# Patient Record
Sex: Male | Born: 1980 | Race: White | Hispanic: No | Marital: Married | Smoking: Never smoker
Health system: Southern US, Community
[De-identification: ages and names within clinical notes are randomized; demographics above are authoritative.]

## PROBLEM LIST (undated history)

## (undated) DIAGNOSIS — F319 Bipolar disorder, unspecified: Secondary | ICD-10-CM

## (undated) DIAGNOSIS — F952 Tourette's disorder: Secondary | ICD-10-CM

## (undated) DIAGNOSIS — E7800 Pure hypercholesterolemia, unspecified: Secondary | ICD-10-CM

## (undated) DIAGNOSIS — E119 Type 2 diabetes mellitus without complications: Secondary | ICD-10-CM

---

## 2020-02-03 ENCOUNTER — Ambulatory Visit: Payer: Medicaid Other | Attending: Internal Medicine

## 2020-02-03 DIAGNOSIS — Z23 Encounter for immunization: Secondary | ICD-10-CM

## 2020-02-03 NOTE — Progress Notes (Signed)
   Covid-19 Vaccination Clinic  Name:  Andrew Lindsey    MRN: [id number] DOB: 08-21-1981  02/03/2020  Mr. Duchemin was observed post Covid-19 immunization for 15 minutes without incident. He was provided with Vaccine Information Sheet and instruction to access the V-Safe system.   Mr. Gersten was instructed to call 911 with any severe reactions post vaccine: Marland Kitchen Difficulty breathing  . Swelling of face and throat  . A fast heartbeat  . A bad rash all over body  . Dizziness and weakness   Immunizations Administered    Name Date Dose VIS Date Route   Pfizer COVID-19 Vaccine 02/03/2020 11:38 AM 0.3 mL 10/29/2019 Intramuscular   Manufacturer: [employer], [employer]   Lot: [id number]   NDC: [address]-8

## 2020-02-28 ENCOUNTER — Ambulatory Visit: Payer: Medicaid Other | Attending: Internal Medicine

## 2020-02-28 DIAGNOSIS — Z23 Encounter for immunization: Secondary | ICD-10-CM

## 2020-02-28 NOTE — Progress Notes (Signed)
   Covid-19 Vaccination Clinic  Name:  Aws Shere    MRN: [id number] DOB: December 29, 1980  02/28/2020  Mr. Imran was observed post Covid-19 immunization for 15 minutes without incident. He was provided with Vaccine Information Sheet and instruction to access the V-Safe system.   Mr. Taglieri was instructed to call 911 with any severe reactions post vaccine: Marland Kitchen Difficulty breathing  . Swelling of face and throat  . A fast heartbeat  . A bad rash all over body  . Dizziness and weakness   Immunizations Administered    Name Date Dose VIS Date Route   Pfizer COVID-19 Vaccine 02/28/2020  8:33 AM 0.3 mL 10/29/2019 Intramuscular   Manufacturer: [employer], [employer]   Lot: [id number]   NDC: [address]-8

## 2021-01-08 ENCOUNTER — Other Ambulatory Visit: Payer: Self-pay

## 2021-01-08 ENCOUNTER — Emergency Department (HOSPITAL_BASED_OUTPATIENT_CLINIC_OR_DEPARTMENT_OTHER)
Admission: EM | Admit: 2021-01-08 | Discharge: 2021-01-08 | Disposition: A | Discharge status: Home / Self Care | Payer: Medicaid Other | Attending: Emergency Medicine | Admitting: Emergency Medicine

## 2021-01-08 ENCOUNTER — Emergency Department (HOSPITAL_BASED_OUTPATIENT_CLINIC_OR_DEPARTMENT_OTHER): Payer: Medicaid Other

## 2021-01-08 ENCOUNTER — Encounter (HOSPITAL_BASED_OUTPATIENT_CLINIC_OR_DEPARTMENT_OTHER): Payer: Self-pay | Admitting: Emergency Medicine

## 2021-01-08 DIAGNOSIS — E119 Type 2 diabetes mellitus without complications: Secondary | ICD-10-CM | POA: Diagnosis not present

## 2021-01-08 DIAGNOSIS — R42 Dizziness and giddiness: Secondary | ICD-10-CM | POA: Insufficient documentation

## 2021-01-08 HISTORY — DX: Tourette's disorder: F95.2

## 2021-01-08 HISTORY — DX: Pure hypercholesterolemia, unspecified: E78.00

## 2021-01-08 HISTORY — DX: Bipolar disorder, unspecified: F31.9

## 2021-01-08 HISTORY — DX: Type 2 diabetes mellitus without complications: E11.9

## 2021-01-08 LAB — CBC WITH DIFFERENTIAL/PLATELET
Abs Immature Granulocytes: 0.05 10*3/uL (ref 0.00–0.07)
Basophils Absolute: 0 10*3/uL (ref 0.0–0.1)
Basophils Relative: 1 %
Eosinophils Absolute: 0.2 10*3/uL (ref 0.0–0.5)
Eosinophils Relative: 3 %
HCT: 42.7 % (ref 39.0–52.0)
Hemoglobin: 15 g/dL (ref 13.0–17.0)
Immature Granulocytes: 1 %
Lymphocytes Relative: 26 %
Lymphs Abs: 1.7 10*3/uL (ref 0.7–4.0)
MCH: 30.2 pg (ref 26.0–34.0)
MCHC: 35.1 g/dL (ref 30.0–36.0)
MCV: 85.9 fL (ref 80.0–100.0)
Monocytes Absolute: 0.3 10*3/uL (ref 0.1–1.0)
Monocytes Relative: 5 %
Neutro Abs: 4.1 10*3/uL (ref 1.7–7.7)
Neutrophils Relative %: 64 %
Platelets: 246 10*3/uL (ref 150–400)
RBC: 4.97 MIL/uL (ref 4.22–5.81)
RDW: 12.9 % (ref 11.5–15.5)
WBC: 6.4 10*3/uL (ref 4.0–10.5)
nRBC: 0 % (ref 0.0–0.2)

## 2021-01-08 LAB — BASIC METABOLIC PANEL
Anion gap: 9 (ref 5–15)
BUN: 20 mg/dL (ref 6–20)
CO2: 26 mmol/L (ref 22–32)
Calcium: 9.2 mg/dL (ref 8.9–10.3)
Chloride: 101 mmol/L (ref 98–111)
Creatinine, Ser: 1.01 mg/dL (ref 0.61–1.24)
GFR, Estimated: >60 mL/min (ref >60)
Glucose, Bld: 120 mg/dL — ABNORMAL HIGH (ref 70–99)
Potassium: 4.1 mmol/L (ref 3.5–5.1)
Sodium: 136 mmol/L (ref 135–145)

## 2021-01-08 LAB — CBG MONITORING, ED
Glucose-Capillary: 107 mg/dL — ABNORMAL HIGH (ref 70–99)
Glucose-Capillary: 110 mg/dL — ABNORMAL HIGH (ref 70–99)

## 2021-01-08 NOTE — ED Provider Notes (Signed)
[facility] EMERGENCY DEPARTMENT Provider Note   CSN: [id number] Arrival date & time: 01/08/21  4098     History Chief Complaint  Patient presents with  . Dizziness    Andrew Lindsey is a 40 y.o. male.  The history is provided by the patient.  Dizziness Quality:  Imbalance Severity:  Mild Onset quality:  Gradual Duration:  4 months Timing:  Intermittent Progression:  Waxing and waning Chronicity:  Recurrent Context comment:  Patient states some intermittnet dizziness, sometimes speech issues for the last few months, thought maybe ambilify side effect. Has stopped that medications for a few weeks now, now on amitrypitline. No current symptoms now. Relieved by:  Nothing Worsened by:  Nothing Associated symptoms: no chest pain, no headaches, no palpitations, no shortness of breath, no vomiting and no weakness   Risk factors: new medications        Past Medical History:  Diagnosis Date  . Bipolar 1 disorder (HCC)   . Diabetes mellitus without complication (HCC)   . Hypercholesteremia   . Tourette's     There are no problems to display for this patient.   PMH: Tourettes, HLD, DM     No family history on file.  Social History   Tobacco Use  . Smoking status: Never Smoker  . Smokeless tobacco: Never Used  Substance Use Topics  . Alcohol use: Not Currently  . Drug use: Never    Home Medications Prior to Admission medications   Medication Sig Start Date End Date Taking? Authorizing Provider  amitriptyline (ELAVIL) 150 MG tablet Take 150 mg by mouth at bedtime. 11/23/20   [provider]  benztropine (COGENTIN) 0.5 MG tablet benztropine 0.5 mg tablet  TK 1 T PO BID PRN    [provider]  glimepiride (AMARYL) 1 MG tablet glimepiride 1 mg tablet  TAKE 1 TO 2 TABLETS BY MOUTH EVERY DAY    [provider]    Allergies    Pravastatin  Review of Systems   Review of Systems  Constitutional: Negative for chills and fever.   HENT: Negative for ear pain and sore throat.   Eyes: Negative for pain and visual disturbance.  Respiratory: Negative for cough and shortness of breath.   Cardiovascular: Negative for chest pain and palpitations.  Gastrointestinal: Negative for abdominal pain and vomiting.  Genitourinary: Negative for dysuria and hematuria.  Musculoskeletal: Negative for arthralgias and back pain.  Skin: Negative for color change and rash.  Neurological: Positive for dizziness and speech difficulty. Negative for tremors, seizures, syncope, facial asymmetry, weakness, light-headedness, numbness and headaches.  All other systems reviewed and are negative.   Physical Exam Updated Vital Signs BP 121/87 (BP Location: Left Arm)   Pulse 88   Temp 97.6 F (36.4 C) (Oral)   Resp 19   Ht 5\' 6"  (1.676 m)   Wt 90.7 kg   SpO2 100%   BMI 32.28 kg/m   Physical Exam Vitals and nursing note reviewed.  Constitutional:      General: He is not in acute distress.    Appearance: He is well-developed and well-nourished. He is not ill-appearing.  HENT:     Head: Normocephalic and atraumatic.     Nose: Nose normal.     Mouth/Throat:     Mouth: Mucous membranes are moist.  Eyes:     Extraocular Movements: Extraocular movements intact.     Conjunctiva/sclera: Conjunctivae normal.     Pupils: Pupils are equal, round, and reactive to light.  Cardiovascular:     Rate and Rhythm: Normal rate and regular rhythm.     Pulses: Normal pulses.     Heart sounds: Normal heart sounds. No murmur heard.   Pulmonary:     Effort: Pulmonary effort is normal. No respiratory distress.     Breath sounds: Normal breath sounds.  Abdominal:     Palpations: Abdomen is soft.     Tenderness: There is no abdominal tenderness.  Musculoskeletal:        General: No edema.     Cervical back: Normal range of motion and neck supple.  Skin:    General: Skin is warm and dry.     Capillary Refill: Capillary refill takes less than 2  seconds.  Neurological:     General: No focal deficit present.     Mental Status: He is alert and oriented to person, place, and time.     Cranial Nerves: No cranial nerve deficit.     Sensory: No sensory deficit.     Motor: No weakness.     Coordination: Coordination normal.     Gait: Gait normal.     Comments: 5+ out of 5 strength all, normal sensation, normal gait, normal finger-nose-finger, no visual field deficit, normal speech, normal finger-to-nose finger  Psychiatric:        Mood and Affect: Mood and affect normal.     ED Results / Procedures / Treatments   Labs (all labs ordered are listed, but only abnormal results are displayed) Labs Reviewed  BASIC METABOLIC PANEL - Abnormal; Notable for the following components:      Result Value   Glucose, Bld 120 (*)    All other components within normal limits  CBG MONITORING, ED - Abnormal; Notable for the following components:   Glucose-Capillary 107 (*)    All other components within normal limits  CBG MONITORING, ED - Abnormal; Notable for the following components:   Glucose-Capillary 110 (*)    All other components within normal limits  CBC WITH DIFFERENTIAL/PLATELET    EKG EKG Interpretation  Date/Time:  Monday January 08 2021 08:24:14 EST Ventricular Rate:  89 PR Interval:    QRS Duration: 97 QT Interval:  347 QTC Calculation: 423 R Axis:   74 Text Interpretation: Sinus rhythm Confirmed by Virgina Norfolk [phone]) on 01/08/2021 8:26:42 AM   Radiology CT Head Wo Contrast  Result Date: 01/08/2021 CLINICAL DATA:  Nonspecific dizziness. Slurred speech and confusion for the past month EXAM: CT HEAD WITHOUT CONTRAST TECHNIQUE: Contiguous axial images were obtained from the base of the skull through the vertex without intravenous contrast. COMPARISON:  07/23/2010 FINDINGS: Brain: No evidence of acute infarction, hemorrhage, hydrocephalus, extra-axial collection or mass lesion/mass effect. Vascular: No hyperdense vessel or  unexpected calcification. Skull: Normal. Negative for fracture or focal lesion. Sinuses/Orbits: No acute finding. IMPRESSION: Negative head CT. Electronically Signed   By: Marnee Spring M.D.   On: 01/08/2021 07:48    Procedures Procedures   Medications Ordered in ED Medications - No data to display  ED Course  I have reviewed the triage vital signs and the nursing notes.  Pertinent labs & imaging results that were available during my care of the patient were reviewed by me and considered in my medical decision making (see chart for details).    MDM Rules/Calculators/A&P                          Andrew Lindsey is a 40 year old male  with history of bipolar, high cholesterol, diabetes who presents to the ED with dizziness.  Overall unremarkable vitals.  Dizziness on and off for the last several months.  No active symptoms now.  Has attempted to get MRI with neurology but states was not approved.  He thought symptoms secondary to being on Abilify which is a new medication.  That has now been stopped recently by primary care doctor.  He denies any current symptoms.  He was stuck with a family member who thought he needed a CT scan of his head.  Denies any headache, vision changes, speech changes currently.  He is neurologically intact on exam.  Normal gait.  Blood sugar upon arrival within normal limits.  Overall appears to be more of a chronic issue.  Some features are concerning for stroke but not having any persistent symptoms.  We will get basic labs and a CT scan of his head and have him follow-up with his neurologist for any further work-up.  Could be a peripheral vertigo type issue could be an electrolyte issue could be medication side effect.  Head CT negative.  No significant anemia, electrolyte abnormality, kidney injury.  EKG shows sinus rhythm.  Overall suspect may be medication side effect.  Recommend follow-up with primary care doctor or neurologist.  No concern for stroke at this  time.  This chart was dictated using voice recognition software.  Despite best efforts to proofread,  errors can occur which can change the documentation meaning.    Final Clinical Impression(s) / ED Diagnoses Final diagnoses:  Dizziness    Rx / DC Orders ED Discharge Orders    None       Virgina Norfolk, DO 01/08/21 [phone]

## 2021-01-08 NOTE — Discharge Instructions (Addendum)
Follow-up with your primary care doctor or neurologist °

## 2021-01-08 NOTE — ED Triage Notes (Signed)
Pt says it's actually been three months of these symptoms; EDP at bedside

## 2021-01-08 NOTE — ED Triage Notes (Signed)
Pt reports dizziness, slurred speech and confusion for the past month; pt reports PCP has seen him, but says "they denied my Medicaid" pt ambulatory, alert and orientedx3, has slurred speech; pt able to communicate effectively during triage and answers questions appropriately; pt says he has also seen a neurologist

## 2021-01-08 NOTE — ED Notes (Addendum)
Per patient, he was seen by his Psychiatrist 2 days ago and she just keep increasing his prescription (Abilify)  per patient.

## 2021-02-21 ENCOUNTER — Encounter (HOSPITAL_COMMUNITY): Payer: Self-pay | Admitting: Physician Assistant

## 2021-02-21 ENCOUNTER — Encounter (HOSPITAL_COMMUNITY): Payer: Self-pay | Admitting: Family

## 2021-03-30 ENCOUNTER — Encounter (HOSPITAL_BASED_OUTPATIENT_CLINIC_OR_DEPARTMENT_OTHER): Payer: Self-pay | Admitting: Emergency Medicine

## 2021-03-30 ENCOUNTER — Other Ambulatory Visit: Payer: Self-pay

## 2021-03-30 ENCOUNTER — Emergency Department (HOSPITAL_BASED_OUTPATIENT_CLINIC_OR_DEPARTMENT_OTHER): Payer: Medicaid Other

## 2021-03-30 ENCOUNTER — Emergency Department (HOSPITAL_BASED_OUTPATIENT_CLINIC_OR_DEPARTMENT_OTHER)
Admission: EM | Admit: 2021-03-30 | Discharge: 2021-03-30 | Disposition: A | Discharge status: Home / Self Care | Payer: Medicaid Other | Attending: Emergency Medicine | Admitting: Emergency Medicine

## 2021-03-30 DIAGNOSIS — R079 Chest pain, unspecified: Secondary | ICD-10-CM | POA: Insufficient documentation

## 2021-03-30 DIAGNOSIS — E119 Type 2 diabetes mellitus without complications: Secondary | ICD-10-CM | POA: Diagnosis not present

## 2021-03-30 DIAGNOSIS — R0602 Shortness of breath: Secondary | ICD-10-CM | POA: Insufficient documentation

## 2021-03-30 DIAGNOSIS — R000 Tachycardia, unspecified: Secondary | ICD-10-CM | POA: Diagnosis not present

## 2021-03-30 DIAGNOSIS — F606 Avoidant personality disorder: Secondary | ICD-10-CM | POA: Insufficient documentation

## 2021-03-30 DIAGNOSIS — Z7984 Long term (current) use of oral hypoglycemic drugs: Secondary | ICD-10-CM | POA: Diagnosis not present

## 2021-03-30 DIAGNOSIS — J029 Acute pharyngitis, unspecified: Secondary | ICD-10-CM

## 2021-03-30 DIAGNOSIS — R6889 Other general symptoms and signs: Secondary | ICD-10-CM

## 2021-03-30 DIAGNOSIS — Z20822 Contact with and (suspected) exposure to covid-19: Secondary | ICD-10-CM | POA: Diagnosis not present

## 2021-03-30 DIAGNOSIS — R059 Cough, unspecified: Secondary | ICD-10-CM

## 2021-03-30 LAB — CBC WITH DIFFERENTIAL/PLATELET
Abs Immature Granulocytes: 0.09 10*3/uL — ABNORMAL HIGH (ref 0.00–0.07)
Basophils Absolute: 0 10*3/uL (ref 0.0–0.1)
Basophils Relative: 1 %
Eosinophils Absolute: 0.2 10*3/uL (ref 0.0–0.5)
Eosinophils Relative: 2 %
HCT: 42.8 % (ref 39.0–52.0)
Hemoglobin: 14.4 g/dL (ref 13.0–17.0)
Immature Granulocytes: 1 %
Lymphocytes Relative: 18 %
Lymphs Abs: 1.6 10*3/uL (ref 0.7–4.0)
MCH: 29.9 pg (ref 26.0–34.0)
MCHC: 33.6 g/dL (ref 30.0–36.0)
MCV: 89 fL (ref 80.0–100.0)
Monocytes Absolute: 0.6 10*3/uL (ref 0.1–1.0)
Monocytes Relative: 7 %
Neutro Abs: 6 10*3/uL (ref 1.7–7.7)
Neutrophils Relative %: 71 %
Platelets: 282 10*3/uL (ref 150–400)
RBC: 4.81 MIL/uL (ref 4.22–5.81)
RDW: 13.5 % (ref 11.5–15.5)
WBC: 8.5 10*3/uL (ref 4.0–10.5)
nRBC: 0 % (ref 0.0–0.2)

## 2021-03-30 LAB — TROPONIN I (HIGH SENSITIVITY): Troponin I (High Sensitivity): 3 ng/L (ref <18)

## 2021-03-30 LAB — BASIC METABOLIC PANEL
Anion gap: 10 (ref 5–15)
BUN: 19 mg/dL (ref 6–20)
CO2: 27 mmol/L (ref 22–32)
Calcium: 9.2 mg/dL (ref 8.9–10.3)
Chloride: 99 mmol/L (ref 98–111)
Creatinine, Ser: 1.02 mg/dL (ref 0.61–1.24)
GFR, Estimated: >60 mL/min (ref >60)
Glucose, Bld: 156 mg/dL — ABNORMAL HIGH (ref 70–99)
Potassium: 4.3 mmol/L (ref 3.5–5.1)
Sodium: 136 mmol/L (ref 135–145)

## 2021-03-30 LAB — GROUP A STREP BY PCR: Group A Strep by PCR: NOT DETECTED

## 2021-03-30 LAB — D-DIMER, QUANTITATIVE: D-Dimer, Quant: 0.39 ug/mL-FEU (ref 0.00–0.50)

## 2021-03-30 MED ORDER — LACTATED RINGERS IV BOLUS
1000.0000 mL | Freq: Once | INTRAVENOUS | Status: AC
  Administered 2021-03-30: 1000 mL via INTRAVENOUS

## 2021-03-30 NOTE — ED Triage Notes (Signed)
Patient complaining of pain in the throat and feels a "bump" in his throat. Complains of a severe cough.

## 2021-03-30 NOTE — Discharge Instructions (Addendum)
You can take 600 mg of ibuprofen every 6 hours, you can take 1000 mg of Tylenol every 6 hours, you can alternate these every 3 or you can take them together.  The Covid test is pending at time of discharge.  Instructions on how to follow this up on my chart are on your discharge paperwork, you can also call the department if you are having trouble finding these results.  If he/she is Covid positive he/she will need to be quarantine for total 5 days since the onset of symptoms +24 hours of no fever and resolving symptoms, additionally he/she needs to wear a mask near all others for 5 more days. If he/she is not Covid positive he/she is able to go back to normal day-to-day routine as long as he/she is not having fevers and it has been 24 hours since his/her last fever.

## 2021-03-30 NOTE — ED Provider Notes (Signed)
[facility] EMERGENCY DEPARTMENT Provider Note   CSN: [id number] Arrival date & time: 03/30/21  1047     History No chief complaint on file.   Andrew Lindsey is a 40 y.o. male.  The history is provided by the patient.  Cough Cough characteristics:  Non-productive Severity:  Moderate Onset quality:  Gradual Duration:  1 week Timing:  Constant Progression:  Worsening Chronicity:  New Context: sick contacts   Relieved by:  Nothing Worsened by:  Nothing Ineffective treatments:  None tried Associated symptoms: chest pain, shortness of breath and sore throat   Associated symptoms: no chills, no fever, no headaches, no rash and no rhinorrhea        Past Medical History:  Diagnosis Date  . Bipolar 1 disorder (HCC)   . Diabetes mellitus without complication (HCC)   . Hypercholesteremia   . Tourette's     There are no problems to display for this patient.   History reviewed. No pertinent surgical history.     History reviewed. No pertinent family history.  Social History   Tobacco Use  . Smoking status: Never Smoker  . Smokeless tobacco: Never Used  Substance Use Topics  . Alcohol use: Not Currently  . Drug use: Never    Home Medications Prior to Admission medications   Medication Sig Start Date End Date Taking? Authorizing Provider  amitriptyline (ELAVIL) 150 MG tablet Take 150 mg by mouth at bedtime. 11/23/20   [provider]  benztropine (COGENTIN) 0.5 MG tablet benztropine 0.5 mg tablet  TK 1 T PO BID PRN    [provider]  glimepiride (AMARYL) 1 MG tablet glimepiride 1 mg tablet  TAKE 1 TO 2 TABLETS BY MOUTH EVERY DAY    [provider]    Allergies    Pravastatin  Review of Systems   Review of Systems  Constitutional: Negative for chills and fever.  HENT: Positive for sore throat. Negative for congestion and rhinorrhea.   Respiratory: Positive for cough and shortness of breath.   Cardiovascular: Positive  for chest pain. Negative for palpitations.  Gastrointestinal: Negative for diarrhea, nausea and vomiting.  Genitourinary: Negative for difficulty urinating and dysuria.  Musculoskeletal: Negative for arthralgias and back pain.  Skin: Negative for color change and rash.  Neurological: Negative for light-headedness and headaches.    Physical Exam Updated Vital Signs BP (!) 121/96   Pulse 98   Temp 98.3 F (36.8 C) (Oral)   Resp 16   Ht 5\' 6"  (1.676 m)   Wt 90.7 kg   SpO2 100%   BMI 32.28 kg/m   Physical Exam Vitals and nursing note reviewed.  Constitutional:      General: He is not in acute distress.    Appearance: Normal appearance.  HENT:     Head: Normocephalic and atraumatic.     Nose: No rhinorrhea.     Mouth/Throat:     Mouth: Mucous membranes are moist.     Pharynx: Posterior oropharyngeal erythema present. No oropharyngeal exudate.  Eyes:     General:        Right eye: No discharge.        Left eye: No discharge.     Conjunctiva/sclera: Conjunctivae normal.  Cardiovascular:     Rate and Rhythm: Regular rhythm. Tachycardia present.     Heart sounds: No murmur heard. No gallop.   Pulmonary:     Effort: Pulmonary effort is normal. No respiratory distress.     Breath sounds: No stridor.  No wheezing or rales.  Abdominal:     General: Abdomen is flat. There is no distension.     Palpations: Abdomen is soft.  Musculoskeletal:        General: No deformity or signs of injury.  Skin:    General: Skin is warm and dry.  Neurological:     General: No focal deficit present.     Mental Status: He is alert. Mental status is at baseline.     Motor: No weakness.  Psychiatric:        Mood and Affect: Mood is anxious.        Speech: Speech is rapid and pressured.        Behavior: Behavior normal.        Thought Content: Thought content normal. Thought content does not include homicidal or suicidal ideation. Thought content does not include homicidal or suicidal plan.      ED Results / Procedures / Treatments   Labs (all labs ordered are listed, but only abnormal results are displayed) Labs Reviewed  CBC WITH DIFFERENTIAL/PLATELET - Abnormal; Notable for the following components:      Result Value   Abs Immature Granulocytes 0.09 (*)    All other components within normal limits  BASIC METABOLIC PANEL - Abnormal; Notable for the following components:   Glucose, Bld 156 (*)    All other components within normal limits  GROUP A STREP BY PCR  SARS CORONAVIRUS 2 (TAT 6-24 HRS)  D-DIMER, QUANTITATIVE  TROPONIN I (HIGH SENSITIVITY)    EKG None  Radiology DG Chest Portable 1 View  Result Date: 03/30/2021 CLINICAL DATA:  Cough and shortness of breath, chest pain. EXAM: PORTABLE CHEST 1 VIEW COMPARISON:  Chest x-rays dated 01/27/2021 and 04/02/2018. FINDINGS: Heart size and mediastinal contours are within normal limits. Lungs are clear. No pleural effusion or pneumothorax is seen. No acute-appearing osseous abnormality. IMPRESSION: No active disease. No evidence of pneumonia or pulmonary edema. Electronically Signed   By: Bary Richard M.D.   On: 03/30/2021 12:14    Procedures Procedures   Medications Ordered in ED Medications  lactated ringers bolus 1,000 mL (1,000 mLs Intravenous New Bag/Given 03/30/21 1144)    ED Course  I have reviewed the triage vital signs and the nursing notes.  Pertinent labs & imaging results that were available during my care of the patient were reviewed by me and considered in my medical decision making (see chart for details).    MDM Rules/Calculators/A&P                          Likely flulike illness however possible underlying cardiopulmonary disease with chest pain shortness of breath.  Will get EKG basic labs, D-dimer low risk [place].  Will get COVID and strep testing.  Patient was exposed to many people as he spent a lot of time on a bus and train here lately.  Wants medication refills for chronic meds as well.   I told her to follow-up with his providers.  This.  EKG is unremarkable acute acute ischemic change involving body arrhythmia.  Troponin is negative.  Chest x-ray after cardiology my review shows no acute cardiopulmonary pathology.  Other labs are fairly unremarkable.  D-dimer is negative.  No further screening needed in the emergency room.  Outside treatment window for COVID.  Would not be a treatment candidate for flu.  So viral testing is pending at time of discharge she is safe for discharge  with return precautions provided Final Clinical Impression(s) / ED Diagnoses Final diagnoses:  Flu-like symptoms  Cough  Sore throat    Rx / DC Orders ED Discharge Orders    None       Sabino Donovan, MD 03/30/21 1247

## 2021-03-30 NOTE — ED Notes (Signed)
Patient unable to sign MSE due to cognitive barriers. Spouse to sign when present.

## 2021-03-31 LAB — SARS CORONAVIRUS 2 (TAT 6-24 HRS): SARS Coronavirus 2: NEGATIVE

## 2021-11-06 ENCOUNTER — Other Ambulatory Visit: Payer: Self-pay

## 2021-11-06 ENCOUNTER — Emergency Department (HOSPITAL_BASED_OUTPATIENT_CLINIC_OR_DEPARTMENT_OTHER)
Admission: EM | Admit: 2021-11-06 | Discharge: 2021-11-06 | Disposition: A | Discharge status: Home / Self Care | Payer: Medicaid Other | Attending: Emergency Medicine | Admitting: Emergency Medicine

## 2021-11-06 ENCOUNTER — Encounter (HOSPITAL_BASED_OUTPATIENT_CLINIC_OR_DEPARTMENT_OTHER): Payer: Self-pay

## 2021-11-06 DIAGNOSIS — R2681 Unsteadiness on feet: Secondary | ICD-10-CM | POA: Insufficient documentation

## 2021-11-06 DIAGNOSIS — Z5321 Procedure and treatment not carried out due to patient leaving prior to being seen by health care provider: Secondary | ICD-10-CM | POA: Diagnosis not present

## 2021-11-06 DIAGNOSIS — Z046 Encounter for general psychiatric examination, requested by authority: Secondary | ICD-10-CM | POA: Insufficient documentation

## 2021-11-06 NOTE — ED Notes (Signed)
Called for a room with no answer, not in lobby will call again

## 2021-11-06 NOTE — ED Triage Notes (Addendum)
Pt states "I need a new psychiatrist"-also c/o URI sx-denies SI/HI-NAD-steady gait-wife with pt-she states "the police brought [place]" pt states they brought him "because I was screaming and was angry"

## 2021-11-11 ENCOUNTER — Emergency Department (HOSPITAL_COMMUNITY)
Admission: EM | Admit: 2021-11-11 | Discharge: 2021-11-13 | Disposition: A | Discharge status: Other Acute Inpatient Hospital | Payer: Medicaid Other | Attending: Emergency Medicine | Admitting: Emergency Medicine

## 2021-11-11 ENCOUNTER — Encounter (HOSPITAL_COMMUNITY): Payer: Self-pay

## 2021-11-11 ENCOUNTER — Other Ambulatory Visit: Payer: Self-pay

## 2021-11-11 DIAGNOSIS — R440 Auditory hallucinations: Secondary | ICD-10-CM | POA: Diagnosis not present

## 2021-11-11 DIAGNOSIS — R000 Tachycardia, unspecified: Secondary | ICD-10-CM | POA: Insufficient documentation

## 2021-11-11 DIAGNOSIS — Z7984 Long term (current) use of oral hypoglycemic drugs: Secondary | ICD-10-CM | POA: Insufficient documentation

## 2021-11-11 DIAGNOSIS — Y900 Blood alcohol level of less than 20 mg/100 ml: Secondary | ICD-10-CM | POA: Diagnosis not present

## 2021-11-11 DIAGNOSIS — F3164 Bipolar disorder, current episode mixed, severe, with psychotic features: Secondary | ICD-10-CM | POA: Diagnosis not present

## 2021-11-11 DIAGNOSIS — R4689 Other symptoms and signs involving appearance and behavior: Secondary | ICD-10-CM

## 2021-11-11 DIAGNOSIS — R443 Hallucinations, unspecified: Secondary | ICD-10-CM

## 2021-11-11 DIAGNOSIS — R456 Violent behavior: Secondary | ICD-10-CM | POA: Insufficient documentation

## 2021-11-11 DIAGNOSIS — E119 Type 2 diabetes mellitus without complications: Secondary | ICD-10-CM | POA: Insufficient documentation

## 2021-11-11 DIAGNOSIS — Z20822 Contact with and (suspected) exposure to covid-19: Secondary | ICD-10-CM | POA: Insufficient documentation

## 2021-11-11 DIAGNOSIS — Z046 Encounter for general psychiatric examination, requested by authority: Secondary | ICD-10-CM | POA: Diagnosis present

## 2021-11-11 DIAGNOSIS — F3163 Bipolar disorder, current episode mixed, severe, without psychotic features: Secondary | ICD-10-CM

## 2021-11-11 LAB — CBC WITH DIFFERENTIAL/PLATELET
Abs Immature Granulocytes: 0.06 10*3/uL (ref 0.00–0.07)
Basophils Absolute: 0.1 10*3/uL (ref 0.0–0.1)
Basophils Relative: 1 %
Eosinophils Absolute: 0.1 10*3/uL (ref 0.0–0.5)
Eosinophils Relative: 1 %
HCT: 41.3 % (ref 39.0–52.0)
Hemoglobin: 14 g/dL (ref 13.0–17.0)
Immature Granulocytes: 1 %
Lymphocytes Relative: 24 %
Lymphs Abs: 1.9 10*3/uL (ref 0.7–4.0)
MCH: 30.1 pg (ref 26.0–34.0)
MCHC: 33.9 g/dL (ref 30.0–36.0)
MCV: 88.8 fL (ref 80.0–100.0)
Monocytes Absolute: 0.5 10*3/uL (ref 0.1–1.0)
Monocytes Relative: 6 %
Neutro Abs: 5.6 10*3/uL (ref 1.7–7.7)
Neutrophils Relative %: 67 %
Platelets: 283 10*3/uL (ref 150–400)
RBC: 4.65 MIL/uL (ref 4.22–5.81)
RDW: 12.7 % (ref 11.5–15.5)
WBC: 8.2 10*3/uL (ref 4.0–10.5)
nRBC: 0 % (ref 0.0–0.2)

## 2021-11-11 LAB — RESP PANEL BY RT-PCR (FLU A&B, COVID) ARPGX2
Influenza A by PCR: NEGATIVE
Influenza B by PCR: NEGATIVE
SARS Coronavirus 2 by RT PCR: NEGATIVE

## 2021-11-11 LAB — RAPID URINE DRUG SCREEN, HOSP PERFORMED
Amphetamines: NOT DETECTED
Barbiturates: NOT DETECTED
Benzodiazepines: NOT DETECTED
Cocaine: NOT DETECTED
Opiates: NOT DETECTED
Tetrahydrocannabinol: NOT DETECTED

## 2021-11-11 LAB — COMPREHENSIVE METABOLIC PANEL
ALT: 38 U/L (ref 0–44)
AST: 26 U/L (ref 15–41)
Albumin: 4.4 g/dL (ref 3.5–5.0)
Alkaline Phosphatase: 54 U/L (ref 38–126)
Anion gap: 10 (ref 5–15)
BUN: 17 mg/dL (ref 6–20)
CO2: 25 mmol/L (ref 22–32)
Calcium: 9.4 mg/dL (ref 8.9–10.3)
Chloride: 103 mmol/L (ref 98–111)
Creatinine, Ser: 1.07 mg/dL (ref 0.61–1.24)
GFR, Estimated: >60 mL/min (ref >60)
Glucose, Bld: 174 mg/dL — ABNORMAL HIGH (ref 70–99)
Potassium: 4.3 mmol/L (ref 3.5–5.1)
Sodium: 138 mmol/L (ref 135–145)
Total Bilirubin: 1 mg/dL (ref 0.3–1.2)
Total Protein: 7.2 g/dL (ref 6.5–8.1)

## 2021-11-11 LAB — ETHANOL: Alcohol, Ethyl (B): <10 mg/dL (ref <10)

## 2021-11-11 LAB — VALPROIC ACID LEVEL: Valproic Acid Lvl: 40 ug/mL — ABNORMAL LOW (ref 50.0–100.0)

## 2021-11-11 MED ORDER — BENZTROPINE MESYLATE 1 MG PO TABS
2.0000 mg | ORAL_TABLET | Freq: Two times a day (BID) | ORAL | Status: DC
  Administered 2021-11-11 – 2021-11-13 (×4): 2 mg via ORAL
  Filled 2021-11-11: qty 2
  Filled 2021-11-11: qty 1
  Filled 2021-11-11: qty 2
  Filled 2021-11-11 (×2): qty 1
  Filled 2021-11-11: qty 2

## 2021-11-11 MED ORDER — FLUPHENAZINE HCL 5 MG PO TABS
15.0000 mg | ORAL_TABLET | Freq: Every day | ORAL | Status: DC
  Administered 2021-11-12: 22:00:00 15 mg via ORAL
  Filled 2021-11-11: qty 3

## 2021-11-11 MED ORDER — LORAZEPAM 2 MG/ML IJ SOLN
2.0000 mg | Freq: Once | INTRAMUSCULAR | Status: AC
  Administered 2021-11-11: 19:00:00 2 mg via INTRAMUSCULAR
  Filled 2021-11-11: qty 1

## 2021-11-11 MED ORDER — GLIMEPIRIDE 2 MG PO TABS
2.0000 mg | ORAL_TABLET | Freq: Every day | ORAL | Status: DC
  Administered 2021-11-12 – 2021-11-13 (×2): 2 mg via ORAL
  Filled 2021-11-11 (×3): qty 1

## 2021-11-11 MED ORDER — ZIPRASIDONE MESYLATE 20 MG IM SOLR
20.0000 mg | Freq: Once | INTRAMUSCULAR | Status: AC
  Administered 2021-11-11: 17:00:00 20 mg via INTRAMUSCULAR
  Filled 2021-11-11: qty 20

## 2021-11-11 MED ORDER — ZIPRASIDONE MESYLATE 20 MG IM SOLR: 20.0000 mg | Freq: Once | INTRAMUSCULAR | Status: DC

## 2021-11-11 MED ORDER — ZIPRASIDONE MESYLATE 20 MG IM SOLR
INTRAMUSCULAR | Status: AC
  Administered 2021-11-11: 12:00:00 10 mg via INTRAMUSCULAR
  Filled 2021-11-11: qty 20

## 2021-11-11 MED ORDER — STERILE WATER FOR INJECTION IJ SOLN
INTRAMUSCULAR | Status: AC
  Administered 2021-11-11: 1.2 mL
  Filled 2021-11-11: qty 10

## 2021-11-11 MED ORDER — LORAZEPAM 2 MG/ML IJ SOLN
2.0000 mg | Freq: Once | INTRAMUSCULAR | Status: DC
  Filled 2021-11-11: qty 1

## 2021-11-11 MED ORDER — LORAZEPAM 2 MG/ML IJ SOLN
2.0000 mg | Freq: Once | INTRAMUSCULAR | Status: AC
  Administered 2021-11-11: 12:00:00 2 mg via INTRAMUSCULAR

## 2021-11-11 MED ORDER — SIMVASTATIN 20 MG PO TABS
10.0000 mg | ORAL_TABLET | Freq: Every day | ORAL | Status: DC
  Administered 2021-11-12 – 2021-11-13 (×2): 10 mg via ORAL
  Filled 2021-11-11 (×3): qty 1

## 2021-11-11 MED ORDER — HALOPERIDOL LACTATE 5 MG/ML IJ SOLN
5.0000 mg | Freq: Once | INTRAMUSCULAR | Status: AC
  Administered 2021-11-11: 19:00:00 5 mg via INTRAMUSCULAR
  Filled 2021-11-11: qty 1

## 2021-11-11 MED ORDER — DIVALPROEX SODIUM 500 MG PO DR TAB
500.0000 mg | DELAYED_RELEASE_TABLET | Freq: Two times a day (BID) | ORAL | Status: DC
  Administered 2021-11-11 – 2021-11-12 (×2): 500 mg via ORAL
  Filled 2021-11-11 (×3): qty 2

## 2021-11-11 MED ORDER — ZIPRASIDONE MESYLATE 20 MG IM SOLR: 10.0000 mg | Freq: Once | INTRAMUSCULAR | Status: AC

## 2021-11-11 NOTE — BH Assessment (Signed)
Comprehensive Clinical Assessment (CCA) Note  11/11/2021 Lekendrick Dippold RS:[id number]  Disposition:  Gave clinical report to S. Rankin, NP, who determined that Pt meets inpatient criteria.  The patient demonstrates the following risk factors for suicide: Chronic risk factors for suicide include: psychiatric disorder of Bipolar, OCD, Tourette's and demographic factors (male, >40 y/o). Acute risk factors for suicide include: social withdrawal/isolation and recently stopped taking Paxil . Protective factors for this patient include: positive social support and positive therapeutic relationship. Considering these factors, the overall suicide risk at this point appears to be low. Patient is not appropriate for outpatient follow up.   [place] [facility] from 11/11/2021 in [place] ED from 11/06/2021 in [place] [facility] from 03/30/2021 in [place] RISK CATEGORY Low Risk No Risk No Risk       Chief Complaint:  Chief Complaint  Patient presents with   Psychiatric Evaluation   Suicidal    Pt presented to the ED with bizarre behavior, suicidal ideation, auditory/visual hallucination, and delusion.  Wife believes Pt is having a reaction to stopping his Paxil five days ago.  Pt ha been on Paxil as part of treatment of Bipolar I Disorder -- he started it about three months ago.  Was becoming more manic.   Visit Diagnosis: Bipolar I, Mixed, Severe w/psychotic features  Narrative:  Pt is a 40 year old male who presented to [facility] on a voluntary basis with complaint of suicidal ideation without plan or intent, anger, auditory/visual hallucination, and delusion.  Pt reported that he has been diagnosed with Schizophrenia, Bipolar Disorder, OCD, and Tourette's.  Pt stated also that he receives outpatient services through  [facility].  History taken from Pt and wife.  Per wife, Pt receives outpatient treatment for  Bipolar Disorder at [facility].  About three months ago, he was put on Paxil, and Pt started to exhibit depressive symptoms and hallucination.  Pt stopped taking the Paxil about five days ago, and he experienced the following symptoms:  Suicidal ideation without plan, auditory hallucination (''people talking in the walls''), visual hallucination (''faces''), delusion (Pt was scared that the doctor was listening to our conversation; he also reported that he is supposed to marry an old girlfriend named Engineer, civil (consulting)), insomnia, and irritability.  Pt stated that he is at the hospital because of ''anger,'' and he stated also that he is afraid he is going to hurt someone.  When asked what help he wanted, Pt responded, ''I need help with my mind.''  Per wife, Pt has been treated inpatient at [facility].  During assessment, Pt presented as alert, restless, and oriented x5.  He had normal eye contact.  Demeanor was irritable, and Pt was physically restless.  He was appropriately groomed.  Mood was angry and depressed.  Affect was mood congruent.  Speech was loud and pressured.  Thought processes were within normal range, and thought content indicated paranoid delusion.  Pt's thought organization was circumstantial.  Memory and concentration were fair.  Insight, judgment, and impulse control were poor.   CCA Screening, Triage and Referral (STR)  Patient Reported Information How did you hear about [place]? Family/Friend  What Is the Reason for Your Visit/Call Today? Pt is a 40 year old male who presented to [facility] on a voluntary basis with complaint of suicidal ideation, auditory/visual hallucination, and delusion.  Pt's wife stated that Pt's symptoms worsened when Pt stopped taking Paxil five days ago.  How Long Has This  Been Causing You Problems? 1-6 months  What Do You Feel Would Help You the Most Today? Treatment for Depression or other mood problem; Medication(s)   Have You Recently Had Any Thoughts About Hurting Yourself?  Yes  Are You Planning to Commit Suicide/Harm Yourself At This time? No   Have you Recently Had Thoughts About [place]? No  Are You Planning to Harm Someone at This Time? No  Explanation: No data recorded  Have You Used Any Alcohol or Drugs in the Past 24 Hours? No  How Long Ago Did You Use Drugs or Alcohol? No data recorded What Did You Use and How Much? No data recorded  Do You Currently Have a Therapist/Psychiatrist? Yes  Name of Therapist/Psychiatrist: Sarita Recently Discharged From Any Office Practice or Programs? No  Explanation of Discharge From Practice/Program: No data recorded    CCA Screening Triage Referral Assessment Type of Contact: Tele-Assessment  Telemedicine Service Delivery: Telemedicine service delivery: This service was provided via telemedicine using a 2-way, interactive audio and video technology  Is this Initial or Reassessment? Initial Assessment  Date Telepsych consult ordered in [facility]:  11/11/21  Time Telepsych consult ordered in [facility]:  No data recorded Location of Assessment: [facility] ED  Provider Location: [facility] Assessment Services   Collateral Involvement: Wife Joelene Millin   Does Patient Have a [place]? No data recorded Name and Contact of Legal Guardian: No data recorded If Minor and Not Living with Parent(s), Who has Custody? No data recorded Is CPS involved or ever been involved? Never  Is APS involved or ever been involved? Never   Patient Determined To Be At Risk for Harm To Self or Others Based on Review of Patient Reported Information or Presenting Complaint? No data recorded Method: No data recorded Availability of Means: No data recorded Intent: No data recorded Notification Required: No data recorded Additional Information for Danger to Others Potential: No data recorded Additional Comments for Danger to Others Potential: No data recorded Are There Guns or  Other Weapons in Your Home? No data recorded Types of Guns/Weapons: No data recorded Are These Weapons Safely Secured?                            No data recorded Who Could Verify You Are Able To Have These Secured: No data recorded Do You Have any Outstanding Charges, Pending Court Dates, Parole/Probation? No data recorded Contacted To Inform of Risk of Harm To Self or Others: No data recorded   Does Patient Present under Involuntary Commitment? No  IVC Papers Initial File Date: No data recorded  [place] of Residence: [place]   Patient Currently Receiving the Following Services: Medication Management   Determination of Need: Urgent (48 hours)   Options For Referral: Medication Management; Inpatient Hospitalization     CCA Biopsychosocial Patient Reported Schizophrenia/Schizoaffective Diagnosis in Past: No (Pt said he has been dx w/schizophrenia; wife disputed)   Strengths: Supportive family   Mental Health Symptoms Depression:   Difficulty Concentrating; Irritability; Sleep (too much or little); Worthlessness   Duration of Depressive symptoms:  Duration of Depressive Symptoms: Greater than two weeks   Mania:   Irritability   Anxiety:    None   Psychosis:   Delusions; Hallucinations   Duration of Psychotic symptoms:  Duration of Psychotic Symptoms: Less than six months   Trauma:   None   Obsessions:   None  Compulsions:   None   Inattention:   None   Hyperactivity/Impulsivity:   None   Oppositional/Defiant Behaviors:   None   Emotional Irregularity:   Intense/inappropriate anger   Other Mood/Personality Symptoms:  No data recorded   Mental Status Exam Appearance and self-care  Stature:   Average   Weight:   Average weight   Clothing:   Casual   Grooming:   Normal   Cosmetic use:  No data recorded  Posture/gait:   Normal   Motor activity:   Restless   Sensorium  Attention:   Persistent   Concentration:   Normal    Orientation:   X5   Recall/memory:   Normal   Affect and Mood  Affect:   Depressed; Negative   Mood:   Angry; Depressed   Relating  Eye contact:   Normal   Facial expression:   Angry   Attitude toward examiner:   Irritable   Thought and Language  Speech flow:  Pressured; Loud   Thought content:   Delusions   Preoccupation:   None   Hallucinations:   Auditory; Visual   Organization:  No data recorded  [employer] of Knowledge:   Average   Intelligence:   Average   Abstraction:   Functional   Judgement:   Poor   Reality Testing:   Adequate   Insight:   Poor   Decision Making:   Impulsive   Social Functioning  Social Maturity:   Impulsive   Social Judgement:   Victimized   Stress  Stressors:   Other (Comment) (Change in medication)   Coping Ability:   Overwhelmed   Skill Deficits:   None   Supports:   Family; Friends/Service system     Religion:    Leisure/Recreation:    Exercise/Diet: Exercise/Diet Have You Gained or Lost A Significant Amount of Weight in the Past Six Months?: No Do You Follow a Special Diet?: No Do You Have Any Trouble Sleeping?: Yes Explanation of Sleeping Difficulties: Poor sleep over five days   CCA Employment/Education Employment/Work Situation: Employment / Work Situation Employment Situation: Employed Patient's Job has Been Impacted by Current Illness: No Has Patient ever Been in Equities trader?: No  Education: Education Is Patient Currently Attending School?: No   CCA Family/Childhood History Family and Relationship History: Family history Marital status: Married Additional relationship information: Wife is Investment banker, operational; she provided collateral  Childhood History:     Child/Adolescent Assessment:     CCA Substance Use Alcohol/Drug Use: Alcohol / Drug Use Pain Medications: Please see MAR Prescriptions: Please see MAR Over the Counter: Please see MAR History of  alcohol / drug use?: No history of alcohol / drug abuse                         ASAM's:  Six Dimensions of Multidimensional Assessment  Dimension 1:  Acute Intoxication and/or Withdrawal Potential:      Dimension 2:  Biomedical Conditions and Complications:      Dimension 3:  Emotional, Behavioral, or Cognitive Conditions and Complications:     Dimension 4:  Readiness to Change:     Dimension 5:  Relapse, Continued use, or Continued Problem Potential:     Dimension 6:  Recovery/Living Environment:     ASAM Severity Score:    ASAM Recommended Level of Treatment:     Substance use Disorder (SUD)    Recommendations for Services/Supports/Treatments:    Discharge Disposition:  DSM5 Diagnoses: There are no problems to display for this patient.    Referrals to Alternative Service(s): Referred to Alternative Service(s):   Place:   Date:   Time:    Referred to Alternative Service(s):   Place:   Date:   Time:    Referred to Alternative Service(s):   Place:   Date:   Time:    Referred to Alternative Service(s):   Place:   Date:   Time:     Marlowe Aschoff, [facility]

## 2021-11-11 NOTE — Progress Notes (Signed)
[facility] is reviewing patient for possible placement.  Andrew Lindsey, MSW, LCSW-A, LCAS-A Phone: [phone] Disposition/TOC

## 2021-11-11 NOTE — ED Notes (Signed)
Wife/POA Domnick Chervenak [phone] OR [phone] would like an update

## 2021-11-11 NOTE — ED Notes (Signed)
Security at bedside

## 2021-11-11 NOTE — ED Provider Notes (Signed)
[facility] EMERGENCY DEPARTMENT Provider Note   CSN: [id number] Arrival date & time: 11/11/21  1014     History Chief Complaint  Patient presents with   Psychiatric Evaluation    Andrew Lindsey is a 40 y.o. male presents to the ED with wife for evaluation of hallucinations and worsening mental health over the past week, but mianly over the past two days. Is accompanied by wife. History and ROS limited due to patient's psychiatric condition.   LEVEL 5 CAVEAT due to the patient's psychiatric condition and AMS.   The history is provided by the patient and the spouse. The history is limited by the condition of the patient.      Past Medical History:  Diagnosis Date   Bipolar 1 disorder (HCC)    Diabetes mellitus without complication (HCC)    Hypercholesteremia    Tourette's     There are no problems to display for this patient.   History reviewed. No pertinent surgical history.     History reviewed. No pertinent family history.  Social History   Tobacco Use   Smoking status: Never   Smokeless tobacco: Never  Vaping Use   Vaping Use: Never used  Substance Use Topics   Alcohol use: Not Currently   Drug use: Never    Home Medications Prior to Admission medications   Medication Sig Start Date End Date Taking? Authorizing Provider  benztropine (COGENTIN) 2 MG tablet Take 2 mg by mouth 2 (two) times daily. 10/24/21  Yes [provider]  divalproex (DEPAKOTE) 250 MG DR tablet Take 500 mg by mouth 2 (two) times daily. 10/24/21  Yes [provider]  fluPHENAZine (PROLIXIN) 10 MG tablet Take 15 mg by mouth at bedtime. 10/24/21  Yes [provider]  glimepiride (AMARYL) 1 MG tablet Take 2 mg by mouth daily with breakfast.   Yes [provider]  simvastatin (ZOCOR) 10 MG tablet Take 10 mg by mouth daily.   Yes [provider]  PARoxetine (PAXIL) 10 MG tablet Take 10 mg by mouth at bedtime. Patient not taking:  Reported on 11/11/2021 10/24/21   [provider]    Allergies    Pravastatin  Review of Systems   Review of Systems  Unable to perform ROS: Psychiatric disorder   Physical Exam Updated Vital Signs BP (!) 139/112 (BP Location: Right Arm)    Pulse (!) 106    Temp 98.5 F (36.9 C) (Oral)    Resp 17    SpO2 100%   Physical Exam Vitals and nursing note reviewed.  Constitutional:      Comments: Screaming, aggressive, agitated, tearful  HENT:     Head: Atraumatic.     Mouth/Throat:     Mouth: Mucous membranes are moist.  Eyes:     General: No scleral icterus. Pulmonary:     Effort: Pulmonary effort is normal. No respiratory distress.  Musculoskeletal:        General: Normal range of motion.  Neurological:     Mental Status: He is alert.     Gait: Gait normal.  Psychiatric:        Mood and Affect: Mood is anxious. Affect is labile, angry, tearful and inappropriate.        Speech: Speech is rapid and pressured and tangential.        Behavior: Behavior is uncooperative, agitated and aggressive.        Thought Content: Thought content is paranoid. Thought content includes homicidal ideation.  Comments: Reports auditory hallucinations    ED Results / Procedures / Treatments   Labs (all labs ordered are listed, but only abnormal results are displayed) Labs Reviewed  COMPREHENSIVE METABOLIC PANEL - Abnormal; Notable for the following components:      Result Value   Glucose, Bld 174 (*)    All other components within normal limits  RESP PANEL BY RT-PCR (FLU A&B, COVID) ARPGX2  ETHANOL  RAPID URINE DRUG SCREEN, HOSP PERFORMED  CBC WITH DIFFERENTIAL/PLATELET    EKG EKG Interpretation  Date/Time:  Sunday November 11 2021 12:41:19 EST Ventricular Rate:  105 PR Interval:  146 QRS Duration: 99 QT Interval:  354 QTC Calculation: 468 R Axis:   87 Text Interpretation: Sinus tachycardia RSR' in V1 or V2, right VCD or RVH Confirmed by Linwood Dibbles [phone]) on 11/11/2021  1:18:54 PM  Radiology No results found.  Procedures Procedures   Medications Ordered in ED Medications  benztropine (COGENTIN) tablet 2 mg (has no administration in time range)  divalproex (DEPAKOTE) DR tablet 500 mg (has no administration in time range)  fluPHENAZine (PROLIXIN) tablet 15 mg (has no administration in time range)  glimepiride (AMARYL) tablet 2 mg (has no administration in time range)  simvastatin (ZOCOR) tablet 10 mg (has no administration in time range)  ziprasidone (GEODON) injection 10 mg (10 mg Intramuscular Given 11/11/21 1226)  LORazepam (ATIVAN) injection 2 mg (2 mg Intramuscular Given by Other 11/11/21 1226)    ED Course  I have reviewed the triage vital signs and the nursing notes.  Pertinent labs & imaging results that were available during my care of the patient were reviewed by me and considered in my medical decision making (see chart for details).  The patient is confused, not oriented to time. He is agressive, and agitated and is threatening to kill his wife multiple times if she leaves. He has tangental, rapid, pressured speech, is appearing manic on exam. He is labile in his mood over a short amount of time. Tearful to screaming threats. Frequent outbursts. Reports that he has been on "65 medications" and that he hears "things in the walls tell me to do stuff". Mentions doctors asking for sexual favors from him. The patient is rocking back and forth on exam in the stretcher. He is screaming the name of another woman at his wife.   The patient's agitated and aggressive state, 2 mg of Ativan along with 10 mg of Geodon were ordered. IVC placed. The patient is now lying on the stretcher sleeping.  For his labs, UDA was negative.  CBC shows no signs of leukocytosis or anemia.  CMP shows no electrolyte abnormalities.  Hyperglycemia seen, patient has a history of diabetes type 2.  Respiratory panel negative for COVID and flu.  Negative alcohol. EKG shows no QT  prolongation. The patient is medically clear awaiting TTS consult. ED psych hold placed. Home meds and diet ordered.    MDM Rules/Calculators/A&P                          Final Clinical Impression(s) / ED Diagnoses Final diagnoses:  Aggressive behavior  Hallucinations    Rx / DC Orders ED Discharge Orders     None        Achille Rich, Cordelia Poche 11/11/21 1325    Linwood Dibbles, MD 11/14/21 1336

## 2021-11-11 NOTE — ED Notes (Signed)
Pt agitated and attempting to rip items off the wall. Pt also flipped stretcher bed. This RN removed stretcher bed for safety. EDP notifed.

## 2021-11-11 NOTE — ED Notes (Signed)
Patient is using profanity while PA is at bedside. Pt reassured, but pt continues to rant. Charge RN and primary RN are aware. Patient is being moved to room 34.

## 2021-11-11 NOTE — ED Notes (Signed)
Sonda Primes (Spouse) called and ask for an update. Call her at [phone]

## 2021-11-11 NOTE — ED Triage Notes (Addendum)
Pt arrived POV from home c/o a psychiatric evaluation. Pt states he has schizophrenia and tourette's and has been on all kinds of medication but he feels worse. He hears voices and sees people who are not there. Pt states he also feels more aggressive. Pt is voluntary.

## 2021-11-11 NOTE — ED Notes (Signed)
This writer was walking back into the Emergency Department through one of the back areas. This writer noticed pt walking by himself with no supervision and was wearing burgundy scrubs, blue socks and no mask. This Clinical research associate asked pt "hey buddy, where are you going?" Pt simply stated "I don't know. Somewhere". This Clinical research associate asked pt if we could go back to his room or with a nurse, pt said "okay but don't touch me". This Clinical research associate and pt walked to blue zone and a staff member stated they where supposed to be in their room Blue Zone RM 034. This writer walked pt to his room, pt sat down and this Clinical research associate closed his door.

## 2021-11-11 NOTE — ED Provider Notes (Signed)
Emergency Medicine Provider Triage Evaluation Note  Andrew Lindsey , a 40 y.o. male  was evaluated in triage.  Pt complains of 'I need to see a psychiatrist.' He states that 'I have been on 65 medications and nothing is helping.' Denies SI/HI but endorses auditory and visual hallucinations. Wife who is present states his condition has been worsening since his psychiatrist changed his medications recently and he has been more aggressive than normal.  Review of Systems  Positive: Hallucinations Negative: SI/HI  Physical Exam  BP (!) 139/112 (BP Location: Right Arm)    Pulse (!) 106    Temp 98.5 F (36.9 C) (Oral)    Resp 17    SpO2 100%  Gen:   Awake, no distress   Resp:  Normal effort  MSK:   Moves extremities without difficulty  Other:    Medical Decision Making  Medically screening exam initiated at 10:47 AM.  Appropriate orders placed.  Andrew Lindsey was informed that the remainder of the evaluation will be completed by another provider, this initial triage assessment does not replace that evaluation, and the importance of remaining in the ED until their evaluation is complete.  Medical clearance labs   Vear Clock 11/11/21 1049    Jacalyn Lefevre, MD 11/11/21 1104

## 2021-11-11 NOTE — ED Notes (Addendum)
Pt's wife called for an update. This RN updated wife.

## 2021-11-11 NOTE — ED Notes (Signed)
Pt found walking down back hallway. Pt was redirected to the room without any difficulty.

## 2021-11-11 NOTE — ED Notes (Signed)
Pt finished eating dinner and is now back in stretcher bed.

## 2021-11-11 NOTE — Progress Notes (Signed)
Per Shuvon Rankin,NP , patient meets criteria for inpatient treatment. There are no available or appropriate beds at [facility] today. CSW faxed referrals to the following facilities for review: ° °[facility]  Pending - No Request Sent N/A [address] [place] [address] [phone] --  °[facility]  Pending - No Request Sent N/A [address] [place] [address] [phone] --  °[facility]  Pending - No Request Sent N/A [address] [place] [address] [phone] --  °[facility]  Pending - No Request Sent N/A [address], [place] [address] [phone] --  °[facility]  Pending - No Request Sent N/A [address], [place] [address] [phone] --  °[facility]  Pending - No Request Sent N/A [address] [place] [address] [phone] --  °[facility]  Pending - No Request Sent N/A [address] [place] [address] [phone] --  °[facility]  Pending - No Request Sent N/A [address] [place] [address] [phone] --  °[facility]  Pending - No Request Sent N/A [address] [place] [address] [phone] --  °[facility]  Pending - No Request Sent N/A [address], [place] [address] [phone] --  °[facility]  Pending - No Request Sent N/A [address], [place] [address] [phone] --  °[facility]  Pending - No Request Sent N/A [address] [place] [address] [phone] --  °[facility]  Pending - No Request Sent N/A [address], [place] [address] [phone] --  °[facility]  Pending - No Request Sent N/A [address], [place]  [address] [phone] --  ° °TTS will continue to seek bed placement. ° °Logen Fowle, MSW, LCSW-A, LCAS-A °Phone: [phone] °Disposition/TOC ° °

## 2021-11-12 LAB — CBG MONITORING, ED
Glucose-Capillary: 253 mg/dL — ABNORMAL HIGH (ref 70–99)
Glucose-Capillary: 112 mg/dL — ABNORMAL HIGH (ref 70–99)
Glucose-Capillary: 239 mg/dL — ABNORMAL HIGH (ref 70–99)

## 2021-11-12 MED ORDER — STERILE WATER FOR INJECTION IJ SOLN
INTRAMUSCULAR | Status: AC
  Filled 2021-11-12: qty 10

## 2021-11-12 MED ORDER — ZIPRASIDONE MESYLATE 20 MG IM SOLR
20.0000 mg | Freq: Once | INTRAMUSCULAR | Status: AC
  Administered 2021-11-12: 16:00:00 20 mg via INTRAMUSCULAR
  Filled 2021-11-12: qty 20

## 2021-11-12 MED ORDER — HALOPERIDOL LACTATE 5 MG/ML IJ SOLN
5.0000 mg | Freq: Once | INTRAMUSCULAR | Status: AC
  Administered 2021-11-12: 21:00:00 5 mg via INTRAVENOUS
  Filled 2021-11-12: qty 1

## 2021-11-12 MED ORDER — LORAZEPAM 2 MG/ML IJ SOLN
2.0000 mg | Freq: Once | INTRAMUSCULAR | Status: AC
  Administered 2021-11-12: 21:00:00 2 mg via INTRAVENOUS
  Filled 2021-11-12: qty 1

## 2021-11-12 MED ORDER — DIVALPROEX SODIUM 500 MG PO DR TAB
1000.0000 mg | DELAYED_RELEASE_TABLET | Freq: Two times a day (BID) | ORAL | Status: DC
  Administered 2021-11-12 – 2021-11-13 (×2): 1000 mg via ORAL
  Filled 2021-11-12 (×2): qty 2

## 2021-11-12 MED ORDER — ZIPRASIDONE MESYLATE 20 MG IM SOLR
20.0000 mg | Freq: Once | INTRAMUSCULAR | Status: AC
  Administered 2021-11-12: 13:00:00 20 mg via INTRAMUSCULAR
  Filled 2021-11-12: qty 20

## 2021-11-12 NOTE — ED Notes (Signed)
Pt woke up and started yelling out loud. Pt was saying off the wall stuff. Pt escalated to kicking the walls and doors. Pt pressed the code alarm. I called security and gave pt a geodon injection.

## 2021-11-12 NOTE — ED Notes (Signed)
Pt took night time meds without difficulty and this RN was also able to update VS. Pt now resting.

## 2021-11-12 NOTE — ED Notes (Signed)
Pt in room yelling and cursing. Coming in and out of room yelling as well. Continuously trying to deescalate pt as he is causing pt across the hall to get agitated. Pt instructed to go back into his room and shut the door

## 2021-11-12 NOTE — ED Notes (Signed)
Breakfast orders placed 

## 2021-11-12 NOTE — ED Notes (Signed)
Pt lying down on mattress on floor now. Sitter monitoring continues.  Security leaving at this time

## 2021-11-12 NOTE — Progress Notes (Signed)
Pt accepted [facility]     Patient meets inpatient criteria per Assunta Found, NP   The attending provider will be Treasa School, MD   Call report to [phone]  Ruffin Frederick, RN @ [facility] ED notified.     Pt scheduled  to arrive at [facility] AFTER 0700.    Damita Dunnings, MSW, LCSW-A  10:53 AM 11/12/2021

## 2021-11-12 NOTE — ED Notes (Addendum)
Pt's wife Cala Bradford) called for update. This RN updated pt' wife. She is concerned for pt's care. This RN explained to wife about the IVC process and what the plan is going forward. Wife's number in pt's chart if needed.

## 2021-11-12 NOTE — ED Provider Notes (Signed)
Emergency Medicine Observation Re-evaluation Note  Andrew Lindsey is a 40 y.o. male, seen on rounds today.  Pt initially presented to the ED for complaints of Psychiatric Evaluation and Suicidal (Pt presented to the ED with bizarre behavior, suicidal ideation, auditory/visual hallucination, and delusion.  Wife believes Pt is having a reaction to stopping his Paxil five days ago.  Pt ha been on Paxil as part of treatment of Bipolar I Disorder -- he started it about three months ago.  Was becoming more manic.) Currently, the patient is resting.  Physical Exam  BP 119/76 (BP Location: Right Arm)    Pulse (!) 102    Temp 97.9 F (36.6 C) (Oral)    Resp 18    SpO2 96%  Physical Exam General: resting at present Cardiac: warm well perfused Lungs: even and unlabored Psych: calm right now  ED Course / MDM  EKG:EKG Interpretation  Date/Time:  Sunday November 11 2021 12:41:19 EST Ventricular Rate:  105 PR Interval:  146 QRS Duration: 99 QT Interval:  354 QTC Calculation: 468 R Axis:   87 Text Interpretation: Sinus tachycardia RSR' in V1 or V2, right VCD or RVH Confirmed by Linwood Dibbles [phone]) on 11/11/2021 1:18:54 PM  I have reviewed the labs performed to date as well as medications administered while in observation.  Recent changes in the last 24 hours include psych rec in pt, no major events overnight  Plan  Current plan is for in patient placement per psych. Lavere Stork is under involuntary commitment.      Milagros Loll, MD 11/12/21 [phone]

## 2021-11-12 NOTE — ED Notes (Addendum)
After PT visitor left at 12:30 pm , PT began to say he wanted to kill hisself. PT was observed  putting a sheet around his neck this NT removed all sheets and blankets, PT stated that he will use his shirt , this NT asked PT to remove his shirt which he did willingly and it was replaced with a paper shirt. PT set on the bed for a min , Pt than begin to say he wanted to see his wife, PT was reminded that visiting hours were over and his visitor just left , PT became very upset when he over heard PT in 51 talking , and begin calling PT " fat bitch "  and "you  are piece of shit "  "go fuck yourself " PT was observed at that time trying to pull the sink off the wall, this NT asked PT to stop PT did willing stop, PT tried to come  out of the room , PT was redirected to go back into the room. PT was observed pulling the rail on the bed this NT and sitter removed the bed.  At this time security  and RN arrived prepared to medicate PT.  PT stated that it wasn't going to work on him. PT pulled off all clothes at that time and paced around the room naked. PT stated that he wanted his bed back, This NT stated to PT that better choices would help him get his bed back. Instead of the hospital bed PT was brought a ER mattress only for PT's safety. PT did lay down on the Mattress . PT was observed falling a sleep.

## 2021-11-12 NOTE — ED Notes (Signed)
Pt threatening to run again. Security has been called. Sitting in chair in his room with door open

## 2021-11-12 NOTE — ED Notes (Signed)
Patient is resting comfortably. Pt is calm and cooperative. Lying in room on mattress

## 2021-11-12 NOTE — ED Notes (Signed)
Sitting up eating breakfast. Tolerated well. Calm and cooperative at this time. No complaints. Sitter at bedside.

## 2021-11-12 NOTE — ED Notes (Signed)
Pt wanded by security. 

## 2021-11-12 NOTE — ED Notes (Signed)
Pt was given personal hygiene products and took a shower.

## 2021-11-12 NOTE — ED Notes (Signed)
Pt got up and ran out the back door of [facility].  Tech followed pt. Security called and [employer] initiated.  Pt returned with security to room. Provider notified for meds.

## 2021-11-12 NOTE — ED Notes (Signed)
Pt sitting in chair in room now. Explained to pt that is unacceptable for him to leave the nursing area and to have staff chasing him down the hallway.  Explained that I am giving him meds to help him calm down.  Pt initially exclaimed that he did not want medication, stating that he would not run again and that it would not help, then he agreeably held out his arm and allowed nurse to medicate him.  He was laughing, saying that it was not going to help and that he was going to run again. Security remaining in place

## 2021-11-12 NOTE — ED Notes (Signed)
Pt lying down on mattress sleeping at this time

## 2021-11-12 NOTE — Progress Notes (Signed)
Patient has been denied by [facility] due to no appropriate beds. Patient meets BH inpatient criteria Assunta Found, NP. Patient has been faxed out to the following facilities:    [facility]  [address], [place] [address] [phone]  [facility] [address] [place]  [address], [place] [address] [phone]  [facility]  [address] [place] [address] [phone]  [facility]  [address] [place] [address] [phone]  [facility]  [address] [place], [place] [address] [phone]  [facility]  [address] [place] [address] [phone]  [facility]  [address] [place] [address] [phone]  [facility]  [address] [place] [address] [phone]  [facility]  [address] [place] [address] [phone]  [facility]  [address], [place] [address] [phone]  [facility]  [address] [place] [address] [phone]  [facility]  [address] [place] [address] [phone]  [facility]  [address], [place] [address] [phone]  [facility]  [address] [place] [address] [phone]   Damita Dunnings, MSW, LCSW-A  10:12 AM 11/12/2021

## 2021-11-13 LAB — CBG MONITORING, ED: Glucose-Capillary: 132 mg/dL — ABNORMAL HIGH (ref 70–99)

## 2021-11-13 NOTE — ED Notes (Signed)
Patient was given a [place] sandwich  and soda for snack

## 2021-11-13 NOTE — ED Notes (Signed)
Lunch ordered 

## 2021-11-13 NOTE — ED Notes (Signed)
Patient notified he would be going to [facility] for further treatment. Informed him it would be about 1 hour until transport gets here for him.

## 2021-11-13 NOTE — ED Provider Notes (Signed)
Emergency Medicine Observation Re-evaluation Note  Andrew Lindsey is a 40 y.o. male, seen on rounds today.  Pt initially presented to the ED for complaints of Psychiatric Evaluation and Suicidal (Pt presented to the ED with bizarre behavior, suicidal ideation, auditory/visual hallucination, and delusion.  Wife believes Pt is having a reaction to stopping his Paxil five days ago.  Pt ha been on Paxil as part of treatment of Bipolar I Disorder -- he started it about three months ago.  Was becoming more manic.) Currently, the patient is Standing at his door, antagonizing other patients.  Physical Exam  BP 131/87 (BP Location: Right Arm)    Pulse (!) 112    Temp 98.1 F (36.7 C) (Oral)    Resp 18    SpO2 99%  Physical Exam General: Awake and alert Lungs: even and unlabored Psych: Agitated  ED Course / MDM  EKG:EKG Interpretation  Date/Time:  Sunday November 11 2021 12:41:19 EST Ventricular Rate:  105 PR Interval:  146 QRS Duration: 99 QT Interval:  354 QTC Calculation: 468 R Axis:   87 Text Interpretation: Sinus tachycardia RSR' in V1 or V2, right VCD or RVH Confirmed by Andrew Lindsey [phone]) on 11/11/2021 1:18:54 PM  I have reviewed the labs performed to date as well as medications administered while in observation.  Recent changes in the last 24 hours include none.  Plan  Current plan is for Accepted for transfer to [facility]. Andrew Lindsey is under involuntary commitment.      Andrew Savoy, MD 11/13/21 [phone]

## 2022-01-28 IMAGING — CT CT HEAD W/O CM
3 series · 16 of 47 positions shown, 19 images · non-contrast
Comparison: 07/23/2010

CLINICAL DATA: Nonspecific dizziness. Slurred speech and confusion
for the past month

EXAM:
CT HEAD WITHOUT CONTRAST
TECHNIQUE: Contiguous axial images were obtained from the base of the skull
through the vertex without intravenous contrast.

[Series 2: head wo · axial · 0.42mm/px · z∈[-141,-11]mm · 10 of 32 slices shown, 13 images]
[im 3/32  brain]
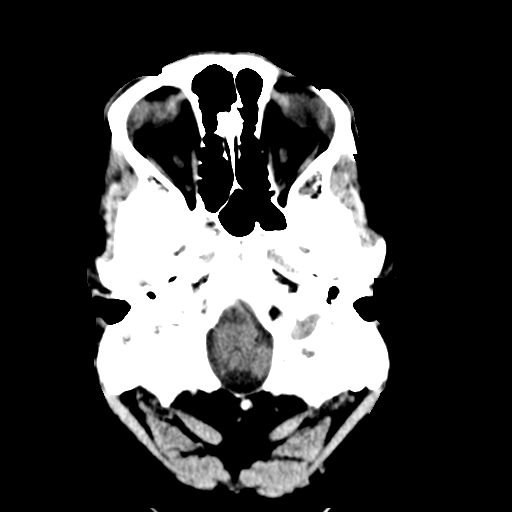
[im 3/32  bone]
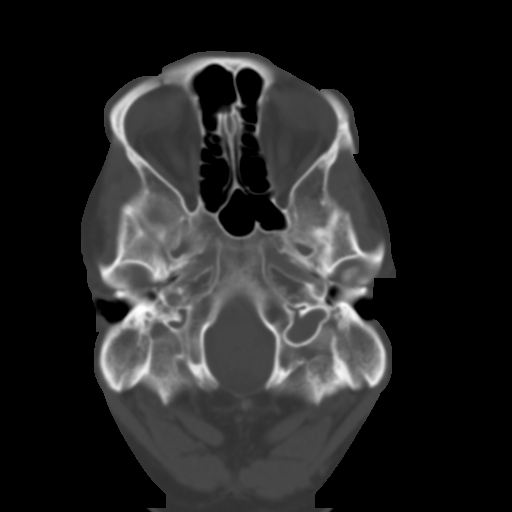
[im 6/32  brain]
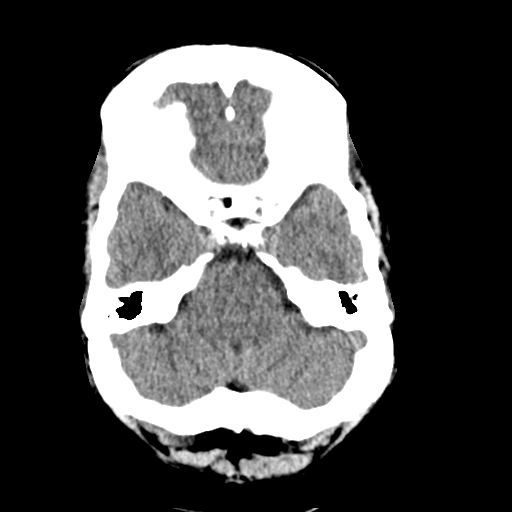
[im 9/32  brain]
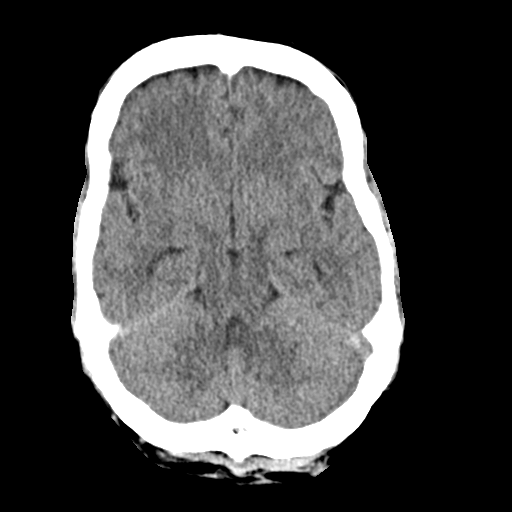
[im 11/32  brain]
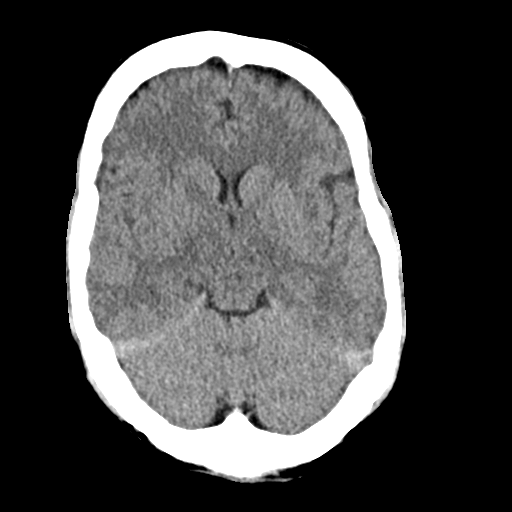
[im 14/32  brain]
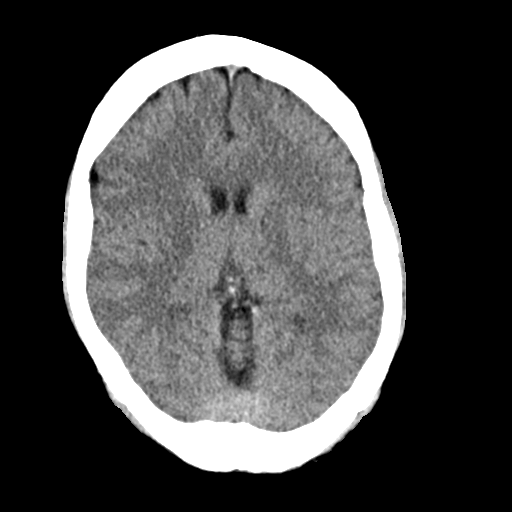
[im 14/32  bone]
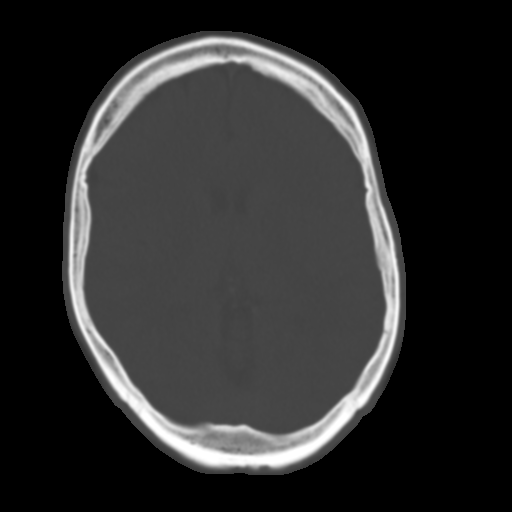
[im 18/32  brain]
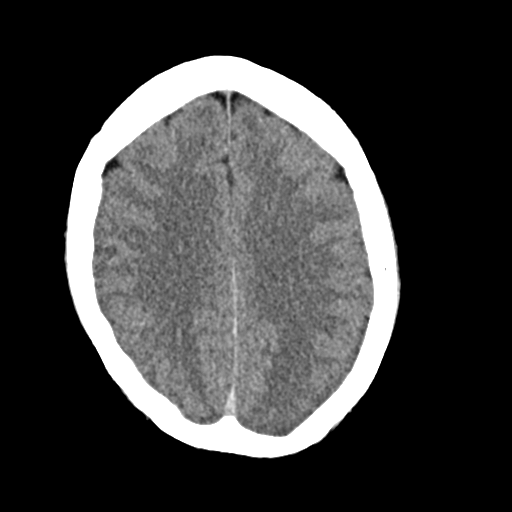
[im 21/32  brain]
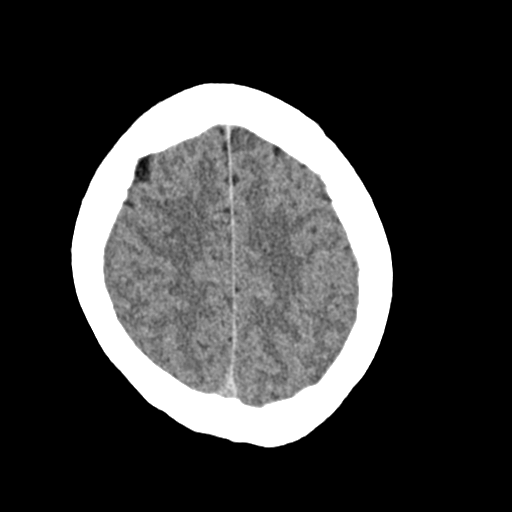
[im 24/32  brain]
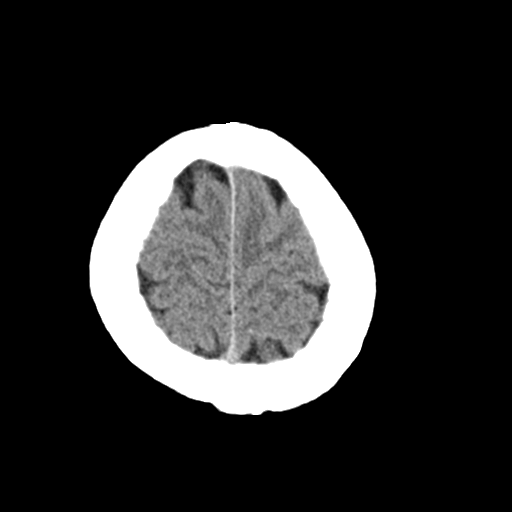
[im 26/32  brain]
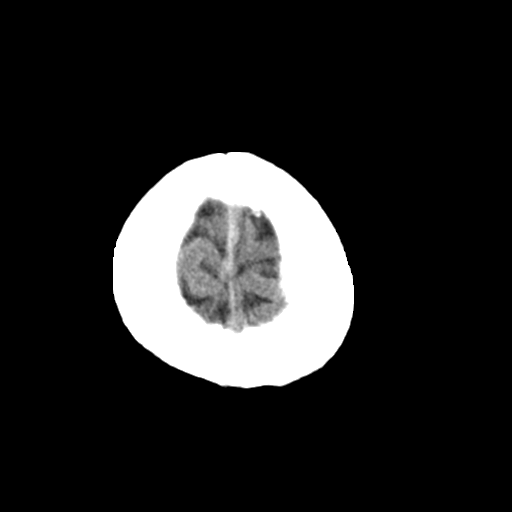
[im 26/32  bone]
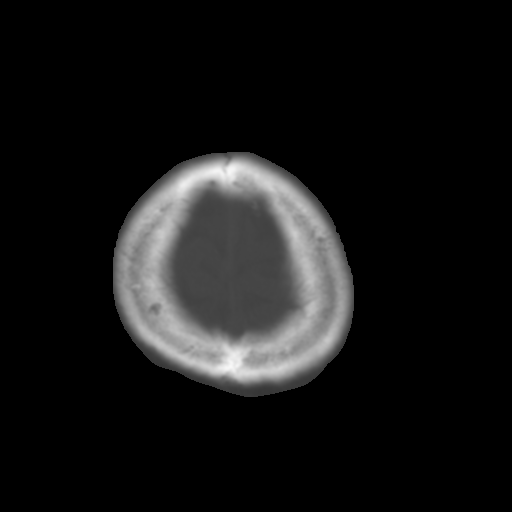
[im 29/32  brain]
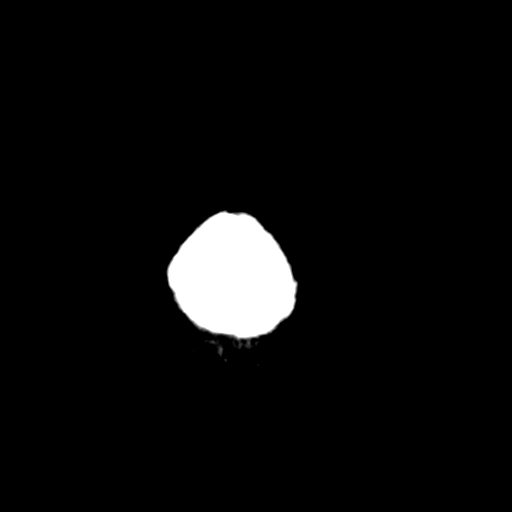

[Series 4: coronal soft · coronal · 0.32mm/px · 3 of 66 slices shown]
[im 22/66  brain]
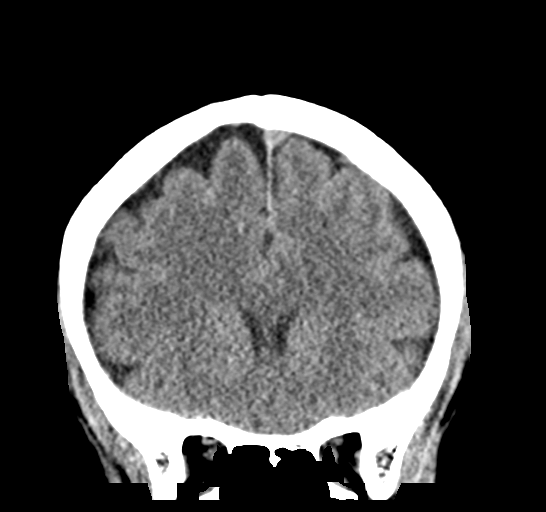
[im 29/66  brain]
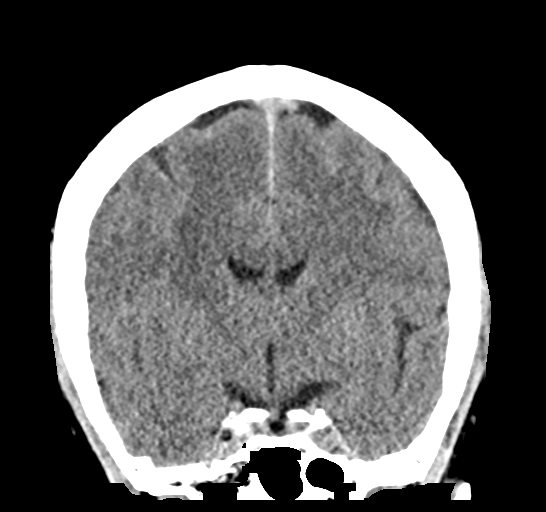
[im 37/66  brain]
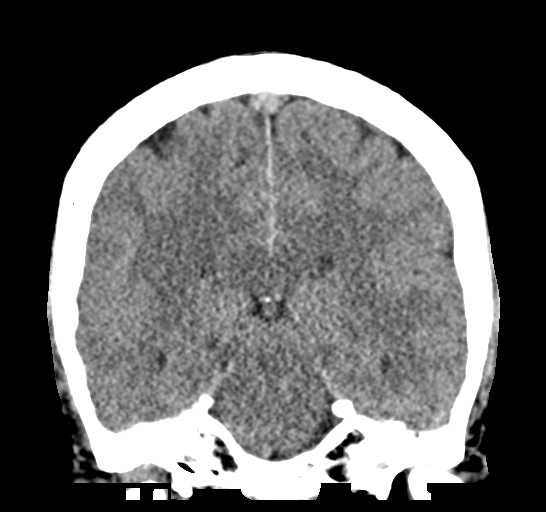

[Series 5: sag soft · sagittal · 0.33mm/px · 3 of 54 slices shown]
[im 18/54  brain]
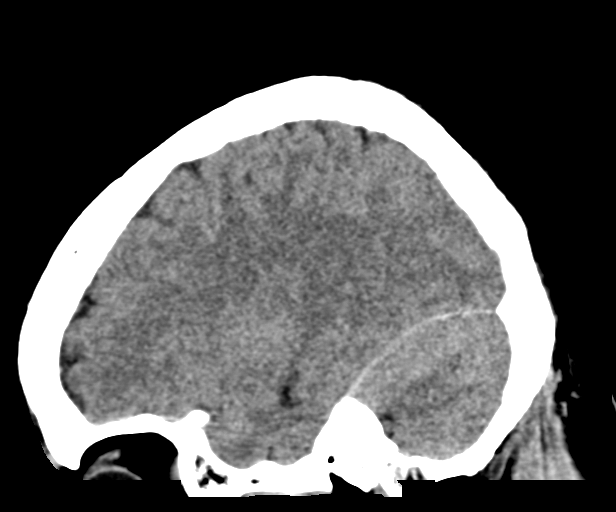
[im 27/54  brain]
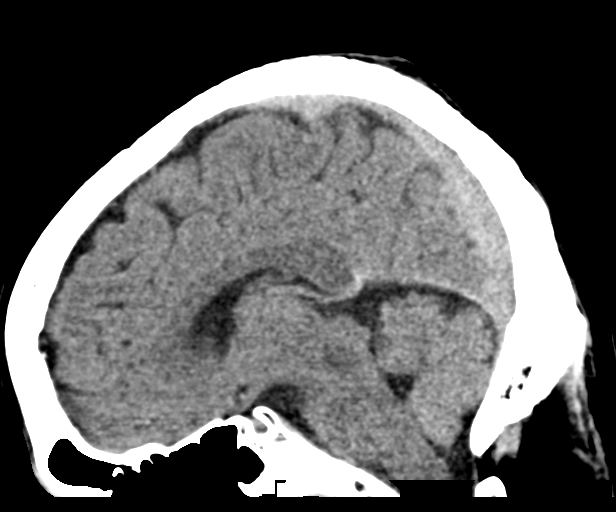
[im 36/54  brain]
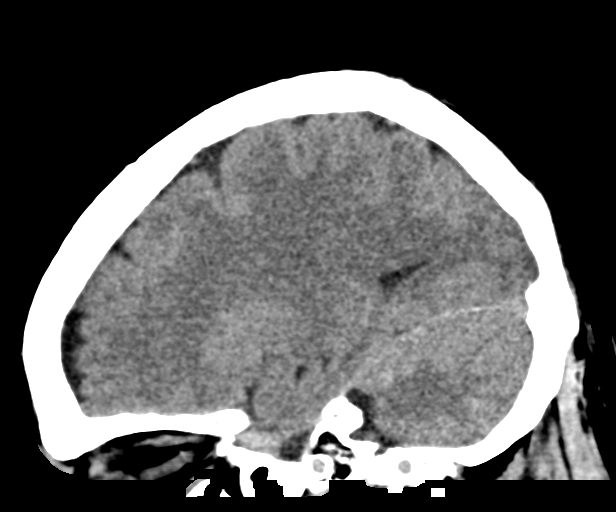

[16 of 47 positions shown; findings below may reference images not displayed]

FINDINGS: Brain: No evidence of acute infarction, hemorrhage, hydrocephalus,
extra-axial collection or mass lesion/mass effect.

Vascular: No hyperdense vessel or unexpected calcification.

Skull: Normal. Negative for fracture or focal lesion.

Sinuses/Orbits: No acute finding.
IMPRESSION: Negative head CT.

## 2022-05-16 ENCOUNTER — Encounter (HOSPITAL_COMMUNITY): Payer: Self-pay

## 2022-05-16 ENCOUNTER — Ambulatory Visit (HOSPITAL_COMMUNITY)
Admission: EM | Admit: 2022-05-16 | Discharge: 2022-05-16 | Disposition: A | Discharge status: Other Acute Inpatient Hospital | Payer: Medicaid Other | Attending: Family | Admitting: Family

## 2022-05-16 ENCOUNTER — Emergency Department (HOSPITAL_COMMUNITY)
Admission: EM | Admit: 2022-05-16 | Discharge: 2022-05-30 | Disposition: A | Discharge status: Home / Self Care | Payer: Medicaid Other | Attending: Emergency Medicine | Admitting: Emergency Medicine

## 2022-05-16 DIAGNOSIS — Z046 Encounter for general psychiatric examination, requested by authority: Secondary | ICD-10-CM | POA: Diagnosis present

## 2022-05-16 DIAGNOSIS — Z7984 Long term (current) use of oral hypoglycemic drugs: Secondary | ICD-10-CM | POA: Insufficient documentation

## 2022-05-16 DIAGNOSIS — Z79899 Other long term (current) drug therapy: Secondary | ICD-10-CM | POA: Diagnosis not present

## 2022-05-16 DIAGNOSIS — F250 Schizoaffective disorder, bipolar type: Secondary | ICD-10-CM | POA: Insufficient documentation

## 2022-05-16 DIAGNOSIS — Z5900 Homelessness unspecified: Secondary | ICD-10-CM | POA: Insufficient documentation

## 2022-05-16 DIAGNOSIS — R451 Restlessness and agitation: Secondary | ICD-10-CM | POA: Diagnosis not present

## 2022-05-16 DIAGNOSIS — Z20822 Contact with and (suspected) exposure to covid-19: Secondary | ICD-10-CM | POA: Insufficient documentation

## 2022-05-16 DIAGNOSIS — E119 Type 2 diabetes mellitus without complications: Secondary | ICD-10-CM | POA: Diagnosis not present

## 2022-05-16 LAB — CBC WITH DIFFERENTIAL/PLATELET
Abs Immature Granulocytes: 0.03 10*3/uL (ref 0.00–0.07)
Basophils Absolute: 0.1 10*3/uL (ref 0.0–0.1)
Basophils Relative: 1 %
Eosinophils Absolute: 0.3 10*3/uL (ref 0.0–0.5)
Eosinophils Relative: 3 %
HCT: 39.1 % (ref 39.0–52.0)
Hemoglobin: 12.8 g/dL — ABNORMAL LOW (ref 13.0–17.0)
Immature Granulocytes: 0 %
Lymphocytes Relative: 18 %
Lymphs Abs: 1.8 10*3/uL (ref 0.7–4.0)
MCH: 28.5 pg (ref 26.0–34.0)
MCHC: 32.7 g/dL (ref 30.0–36.0)
MCV: 87.1 fL (ref 80.0–100.0)
Monocytes Absolute: 0.5 10*3/uL (ref 0.1–1.0)
Monocytes Relative: 5 %
Neutro Abs: 7.4 10*3/uL (ref 1.7–7.7)
Neutrophils Relative %: 73 %
Platelets: 364 10*3/uL (ref 150–400)
RBC: 4.49 MIL/uL (ref 4.22–5.81)
RDW: 13.2 % (ref 11.5–15.5)
WBC: 10.1 10*3/uL (ref 4.0–10.5)
nRBC: 0 % (ref 0.0–0.2)

## 2022-05-16 LAB — COMPREHENSIVE METABOLIC PANEL
ALT: 12 U/L (ref 0–44)
AST: 14 U/L — ABNORMAL LOW (ref 15–41)
Albumin: 3.9 g/dL (ref 3.5–5.0)
Alkaline Phosphatase: 74 U/L (ref 38–126)
Anion gap: 8 (ref 5–15)
BUN: 11 mg/dL (ref 6–20)
CO2: 24 mmol/L (ref 22–32)
Calcium: 9 mg/dL (ref 8.9–10.3)
Chloride: 107 mmol/L (ref 98–111)
Creatinine, Ser: 0.86 mg/dL (ref 0.61–1.24)
GFR, Estimated: >60 mL/min (ref >60)
Glucose, Bld: 149 mg/dL — ABNORMAL HIGH (ref 70–99)
Potassium: 3.5 mmol/L (ref 3.5–5.1)
Sodium: 139 mmol/L (ref 135–145)
Total Bilirubin: 0.6 mg/dL (ref 0.3–1.2)
Total Protein: 6.4 g/dL — ABNORMAL LOW (ref 6.5–8.1)

## 2022-05-16 LAB — RAPID URINE DRUG SCREEN, HOSP PERFORMED
Amphetamines: NOT DETECTED
Barbiturates: NOT DETECTED
Benzodiazepines: NOT DETECTED
Cocaine: NOT DETECTED
Opiates: NOT DETECTED
Tetrahydrocannabinol: NOT DETECTED

## 2022-05-16 LAB — RESP PANEL BY RT-PCR (FLU A&B, COVID) ARPGX2
Influenza A by PCR: NEGATIVE
Influenza B by PCR: NEGATIVE
SARS Coronavirus 2 by RT PCR: NEGATIVE

## 2022-05-16 LAB — ETHANOL: Alcohol, Ethyl (B): <10 mg/dL (ref <10)

## 2022-05-16 MED ORDER — OLANZAPINE 10 MG PO TBDP
10.0000 mg | ORAL_TABLET | Freq: Three times a day (TID) | ORAL | Status: DC | PRN
  Administered 2022-05-16: 10 mg via ORAL
  Filled 2022-05-16: qty 1

## 2022-05-16 MED ORDER — ACETAMINOPHEN 325 MG PO TABS: 650.0000 mg | ORAL_TABLET | ORAL | Status: DC | PRN

## 2022-05-16 MED ORDER — ZIPRASIDONE MESYLATE 20 MG IM SOLR: 20.0000 mg | INTRAMUSCULAR | Status: DC | PRN

## 2022-05-16 MED ORDER — LORAZEPAM 1 MG PO TABS
1.0000 mg | ORAL_TABLET | ORAL | Status: AC | PRN
  Administered 2022-05-16: 1 mg via ORAL
  Filled 2022-05-16: qty 1

## 2022-05-16 MED ORDER — ONDANSETRON HCL 4 MG PO TABS: 4.0000 mg | ORAL_TABLET | Freq: Three times a day (TID) | ORAL | Status: DC | PRN

## 2022-05-16 NOTE — ED Notes (Signed)
Report called to [facility] RN at [facility]. [employer] called for transportation services. Three copies of IVC and emtala placed in an envelope. Safety maintained and will continue to monitor.

## 2022-05-16 NOTE — ED Notes (Addendum)
Mr Andrew Lindsey urinated on floor and wall of assessment room 137 EVS called after gross urine removed by this Clinical research associate. Pt transported by [employer] x 2 officers to [facility] ED.

## 2022-05-16 NOTE — ED Notes (Signed)
Patient arrived to [facility] bed by PD, wanded down by security [place].   Patient went straight to sleep after swabbing patient nose, provided a meal upon pt request. Reports having thoughts of SI with no plan or intentions of acting on it, also reports hearing and seeing things.

## 2022-05-16 NOTE — ED Provider Notes (Signed)
Patient visualized by this writer, appears to be speaking to himself, alone in assessment room. Patient likely responding it internal stimuli. Continues to escalate, increasingly verbally aggressive.  Patient awaiting law enforcement transport to [employer].  Patient reviewed with Dr Bronwen Betters.  Agitation protocol initiated including: -Olanzapine Zydis 10 mg every 8 hours as needed/agitation -Lorazepam 1 mg as needed as needed/anxiety or severe agitation for 1 dose -Ziprasidone 20 mg IM as needed/agitation for 1 dose

## 2022-05-16 NOTE — ED Provider Notes (Signed)
Behavioral Health Urgent Care Medical Screening Exam  Patient Name: Andrew Lindsey MRN: [id number] Date of Evaluation: 05/16/22 Chief Complaint:   Diagnosis:  Final diagnoses:  Schizoaffective disorder, bipolar type (HCC)    History of Present illness: Andrew Lindsey is a 41 y.o. male. Patient presents to [facility] behavioral health via law enforcement under involuntary commitment.    Petition reads: "[name] has been in the [facility] jail for 91 days.  [facility] but [facility] were no [facility] available.  He court case was dismissed today.  And he is not in a mental state to take care of himself upon release.  He is unaware of his surroundings and does not have a clear picture of where he is.  He is having conversations with people that are not there.  He does suffer from schizoaffective disorder with mood stability and hallucinations.  While in jail he has refused to take his medication at times.  Patient is briefly assessed, face-to-face, by this nurse practitioner.  He is alert to self and partially alert to situation.  He presents with anxious mood and labile affect.  Patient's conversation is tangential in nature, speech is pressured with elevated volume.  It is difficult for this writer to understand patient related to his rapid speech and disorganized thought process.  He denies suicidal and homicidal ideations.  He is a limited historian at this time.  Patient shares that he has been stable on lithium in the past, reports that while in jail he saw a psychiatrist to update his medication to Abilify.  It is unclear if patient was compliant with Abilify while in jail.  Patient does not endorse auditory visual hallucinations however it appears to this writer patient is likely responding to internal stimuli, noted to be muttering under his breath and looking toward unoccupied for respiratory.  Patient shares that he is  currently homeless in [place].  He reports he is married to his wife, Andrew Lindsey, who is changed her phone number.  No alcohol or substance use reported.  Patient endorses average sleep and appetite however he is a limited historian.  Patient offered support and encouragement.  Patient made aware he will be transported to emergency department at [facility], verbalizes understanding.    Psychiatric Specialty Exam  Presentation  General Appearance:Disheveled  Eye Contact:Minimal  Speech:Pressured  Speech Volume:Increased  Handedness:Right   Mood and Affect  Mood:Anxious; Labile  Affect:Labile   Thought Process  Thought Processes:Disorganized  Descriptions of Associations:Tangential  Orientation:Full (Time, Place and Person)  Thought Content:Tangential  Diagnosis of Schizophrenia or Schizoaffective disorder in past: No (Pt said he has been dx w/schizophrenia; wife disputed)  Duration of Psychotic Symptoms: Less than six months  Hallucinations:-- (appears to be potentially responding to internal stimuli)  Ideas of Reference:Paranoia  Suicidal Thoughts:No  Homicidal Thoughts:No   Sensorium  Memory:Immediate Fair  Judgment:Impaired  Insight:Lacking   Executive Functions  Concentration:Poor  Attention Span:Poor  Recall:Poor  Fund of Knowledge:Good  Language:Good   Psychomotor Activity  Psychomotor Activity:Normal   Assets  Assets:Communication Skills   Sleep  Sleep:Fair  Number of hours: No data recorded  No data recorded  Physical Exam: Physical Exam Vitals and nursing note reviewed.  Constitutional:      Appearance: Normal appearance. He is well-developed.  HENT:     Head: Normocephalic and atraumatic.     Nose: Nose normal.  Cardiovascular:     Rate and Rhythm: Normal rate.  Pulmonary:  Effort: Pulmonary effort is normal.  Musculoskeletal:        General: Normal range of motion.     Cervical back: Normal range of motion.   Skin:    General: Skin is warm and dry.  Neurological:     Mental Status: He is alert and oriented to person, place, and time.  Psychiatric:        Attention and Perception: Attention normal.        Mood and Affect: Mood is anxious. Affect is labile.        Speech: Speech is rapid and pressured and tangential.        Behavior: Behavior is aggressive.        Thought Content: Thought content is paranoid.        Judgment: Judgment is inappropriate.    Review of Systems  Constitutional: Negative.   HENT: Negative.    Eyes: Negative.   Respiratory: Negative.    Cardiovascular: Negative.   Gastrointestinal: Negative.   Genitourinary: Negative.   Musculoskeletal: Negative.   Skin: Negative.   Neurological: Negative.   Psychiatric/Behavioral:  The patient is nervous/anxious.    Blood pressure 121/85, pulse 92, temperature 98.7 F (37.1 C), temperature source Oral, resp. rate 19, SpO2 100 %. There is no height or weight on file to calculate BMI.  Musculoskeletal: Strength & Muscle Tone: within normal limits Gait & Station: normal Patient leans: N/A   [facility] MSE Discharge Disposition for Follow up and Recommendations: Based on my evaluation the patient appears to have an emergency medical condition for which I recommend the patient be transferred to the emergency department for further evaluation.  Patient reviewed with Dr. Bronwen Betters. Patient remains under involuntary commitment petition, he presents with psychosis in verbally aggressive behavior.  He is inappropriate for [facility] behavioral health at this time.  Patient accepted to [facility] emergency department by Dr. Renaye Rakers.  Inpatient psychiatric treatment recommended at this time.  Lenard Lance, FNP 05/16/2022, 5:55 PM

## 2022-05-16 NOTE — ED Triage Notes (Signed)
Pt brought in by [employer] under IVC. The patient spent the last 91 days incarcerated and refused to take his psych meds (hx of schizophrenia). He was released from jail today but was IVC'd by [employer] due to him hallucinating "having conversations with people not there" and confused about where he is/unaware of his surroundings. Denies SI/HI but reports auditory hallucinations.

## 2022-05-16 NOTE — ED Provider Notes (Signed)
[facility] EMERGENCY DEPARTMENT Provider Note   CSN: [id number] Arrival date & time: 05/16/22  2124     History  Chief Complaint  Patient presents with   IVC    Samuele Storey is a 41 y.o. male.  The history is provided by the police and medical records. No language interpreter was used.    41 year old male with significant history of bipolar, DM, HLD brought here accompanied by [employer] with IVC paper for concerns of psychosis.  Per triage note, patient has been incarcerated for the past 3 months and have refused to take his psychiatric medication.  He does have history of schizophrenia.  He was released from jail today but was IVC due to patient having auditory hallucination and confusion.  Home Medications Prior to Admission medications   Medication Sig Start Date End Date Taking? Authorizing Provider  benztropine (COGENTIN) 2 MG tablet Take 2 mg by mouth 2 (two) times daily. 10/24/21   [provider]  divalproex (DEPAKOTE) 250 MG DR tablet Take 500 mg by mouth 2 (two) times daily. 10/24/21   [provider]  fluPHENAZine (PROLIXIN) 10 MG tablet Take 15 mg by mouth at bedtime. 10/24/21   [provider]  glimepiride (AMARYL) 1 MG tablet Take 2 mg by mouth daily with breakfast.    [provider]  PARoxetine (PAXIL) 10 MG tablet Take 10 mg by mouth at bedtime. Patient not taking: Reported on 11/11/2021 10/24/21   [provider]  simvastatin (ZOCOR) 10 MG tablet Take 10 mg by mouth daily.    [provider]      Allergies    Pravastatin    Review of Systems   Review of Systems  Unable to perform ROS: Psychiatric disorder    Physical Exam Updated Vital Signs BP (!) 149/111 (BP Location: Right Arm)   Pulse 96   Temp 98.8 F (37.1 C) (Oral)   Resp 18   SpO2 100%  Physical Exam Vitals and nursing note reviewed.  Constitutional:      General: He is not in acute distress.    Appearance: He is  well-developed.     Comments: Sleeping but arousable appears to be in no acute discomfort  HENT:     Head: Atraumatic.  Eyes:     Conjunctiva/sclera: Conjunctivae normal.  Cardiovascular:     Rate and Rhythm: Normal rate and regular rhythm.     Pulses: Normal pulses.     Heart sounds: Normal heart sounds.  Musculoskeletal:     Cervical back: Neck supple.  Skin:    Findings: No rash.  Psychiatric:        Mood and Affect: Affect is blunt.        Speech: Speech is delayed.        Behavior: Behavior is slowed.        Thought Content: Thought content does not include homicidal or suicidal ideation.        Cognition and Memory: Cognition is impaired.        Judgment: Judgment is inappropriate.     Comments: Response to internal stimuli.     ED Results / Procedures / Treatments   Labs (all labs ordered are listed, but only abnormal results are displayed) Labs Reviewed  COMPREHENSIVE METABOLIC PANEL - Abnormal; Notable for the following components:      Result Value   Glucose, Bld 149 (*)    Total Protein 6.4 (*)    AST 14 (*)  All other components within normal limits  CBC WITH DIFFERENTIAL/PLATELET - Abnormal; Notable for the following components:   Hemoglobin 12.8 (*)    All other components within normal limits  RESP PANEL BY RT-PCR (FLU A&B, COVID) ARPGX2  ETHANOL  RAPID URINE DRUG SCREEN, HOSP PERFORMED    EKG None  Radiology No results found.  Procedures Procedures    Medications Ordered in ED Medications  acetaminophen (TYLENOL) tablet 650 mg (has no administration in time range)  ondansetron (ZOFRAN) tablet 4 mg (has no administration in time range)    ED Course/ Medical Decision Making/ A&P Clinical Course as of 05/16/22 2244  Thu May 16, 2022  619 41 year old male prior psychiatric history brought in by [employer] under IVC for not taking his medications and bizarre behavior.  Possibly hallucinating.  Patient getting screening labs and psychiatric  evaluation.  Disposition per psychiatry recommendations [MB]    Clinical Course User Index [MB] Terrilee Files, MD                           Medical Decision Making  BP (!) 149/111 (BP Location: Right Arm)   Pulse 96   Temp 98.8 F (37.1 C) (Oral)   Resp 18   SpO2 100%   10:41 PM This is a 41 year old male with significant history of schizophrenia, bipolar, who has been incarcerated for the past 3 months released from jail today but was IVC by [employer] and brought here for further management.  Patient has been noncompliant with his psychiatric medication and when released, he is response to internal stimuli, and having auditory hallucination.  He does not know where he is at.  No report of SI or HI.  History is limited.  Initially the hope was to have patient admitted to another facility for his chronic psychiatric illness however there are no beds available therefore patient was IVC and brought here.  Patient on exam is sleeping but arousable, he does response to internal stimuli and is a poor historian.  IVC paper and first exam found as appropriate.  Labs obtained and independently viewed interpreted by me and patient is medically cleared.  Will request TTS and psychiatry to assess patient and likely admit for psychiatric placement.        Final Clinical Impression(s) / ED Diagnoses Final diagnoses:  Schizoaffective disorder, bipolar type [facility])    Rx / DC Orders ED Discharge Orders     None         Fayrene Helper, PA-C 05/16/22 2325    Terrilee Files, MD 05/17/22 1030

## 2022-05-16 NOTE — ED Notes (Signed)
Belongings stored in [place] 4, 1 bag-contained a black jacket, shorts, and orange sandals.

## 2022-05-16 NOTE — ED Notes (Signed)
Pt wanded by security. 

## 2022-05-16 NOTE — Progress Notes (Signed)
Patient was [facility] by the police on IVC.  Patient was incarcerated for 91 days and he was released from jail today because there was no space for him in [address] and his case was dismissed.  Patient has a history of schizo-affective disorder bipolar type and apparently has not been on his medication.  His speech is rapid and pressured, he is irritated, he has not been sleeping or bathing.  He also states thast he has tourette's.  Patient is manic/agiated, denies SI, but when asked if he is homicidal, he said yes, when asked towards who, he looks at the [employer] officer who brought him here. Patient has a history of AVH.  Police took out IVC because they did not feel like he was capable of taking care of himself.  Patient is urgent.

## 2022-05-16 NOTE — ED Provider Triage Note (Signed)
Emergency Medicine Provider Triage Evaluation Note  Andrew Lindsey , a 41 y.o. male  was evaluated in triage.  Pt complains of anxiety and confusion.  Patient spent the past 91 days incarcerated and refused to take his psychiatric medications.  Patient has history of schizophrenia.  He was released from jail today but was IVC need due to auditory hallucinations and confusion.  Attempts were made to have patient placed another hospital with no success.  Patient is unaware of surroundings at times.  Currently denies SI/HI  Review of Systems  Positive: Hallucinations Negative: SI/HI  Physical Exam  There were no vitals taken for this visit. Gen:   Awake, anxious Resp:  Normal effort  MSK:   Moves extremities without difficulty  Other:    Medical Decision Making  Medically screening exam initiated at 9:31 PM.  Appropriate orders placed.  Andrew Lindsey was informed that the remainder of the evaluation will be completed by another provider, this initial triage assessment does not replace that evaluation, and the importance of remaining in the ED until their evaluation is complete.     Darrick Grinder, PA-C 05/16/22 2132

## 2022-05-16 NOTE — ED Notes (Signed)
Writer called [employer] for transfer to [facility]. [employer] stated they do not have a police car to transfer him at this time

## 2022-05-16 NOTE — ED Notes (Signed)
Andrew Lindsey was observed by staff yell and banging on unit door orders for PRN medication obtained and Zyprexa 10 mg po and Ativan 1 mg po given.

## 2022-05-17 ENCOUNTER — Encounter (HOSPITAL_COMMUNITY): Payer: Self-pay | Admitting: Nurse Practitioner

## 2022-05-17 DIAGNOSIS — F250 Schizoaffective disorder, bipolar type: Secondary | ICD-10-CM

## 2022-05-17 DIAGNOSIS — R451 Restlessness and agitation: Secondary | ICD-10-CM

## 2022-05-17 MED ORDER — ARIPIPRAZOLE 5 MG PO TABS
5.0000 mg | ORAL_TABLET | Freq: Every day | ORAL | Status: DC
  Filled 2022-05-17: qty 1

## 2022-05-17 MED ORDER — LORAZEPAM 2 MG/ML IJ SOLN
1.0000 mg | Freq: Once | INTRAMUSCULAR | Status: DC
Start: 2022-05-17 — End: 2022-05-17

## 2022-05-17 MED ORDER — ARIPIPRAZOLE 10 MG PO TABS
10.0000 mg | ORAL_TABLET | Freq: Every day | ORAL | Status: DC
  Administered 2022-05-17 – 2022-05-19 (×2): 10 mg via ORAL
  Filled 2022-05-17: qty 1

## 2022-05-17 MED ORDER — OLANZAPINE 5 MG PO TBDP
10.0000 mg | ORAL_TABLET | Freq: Two times a day (BID) | ORAL | Status: DC | PRN
  Administered 2022-05-17 – 2022-05-23 (×6): 10 mg via ORAL
  Filled 2022-05-17 (×9): qty 2

## 2022-05-17 MED ORDER — ARIPIPRAZOLE 10 MG PO TABS
10.0000 mg | ORAL_TABLET | Freq: Every day | ORAL | Status: DC
Start: 2022-05-18 — End: 2022-05-17
  Filled 2022-05-17: qty 1

## 2022-05-17 MED ORDER — LORAZEPAM 1 MG PO TABS
1.0000 mg | ORAL_TABLET | Freq: Two times a day (BID) | ORAL | Status: DC | PRN
  Administered 2022-05-17 – 2022-05-30 (×11): 1 mg via ORAL
  Filled 2022-05-17 (×13): qty 1

## 2022-05-17 MED ORDER — ZIPRASIDONE MESYLATE 20 MG IM SOLR
20.0000 mg | Freq: Once | INTRAMUSCULAR | Status: AC
  Administered 2022-05-17: 20 mg via INTRAMUSCULAR

## 2022-05-17 NOTE — ED Notes (Signed)
PT is now resting in bed. Security to standby

## 2022-05-17 NOTE — ED Notes (Addendum)
Security at bedside but pt was compliant with allowing nurse to give IM injection. He is verbally belligerent towards security, nurse, [employer]. Pt looks up at ceiling talking to "Nate" asking if he should attack staff.

## 2022-05-17 NOTE — Progress Notes (Signed)
CSW requested [facility] Oluwatosin Olasunkanmi, RN to review for [facility]. CSW will assist and follow with placement.   Maryjean Ka, MSW, LCSWA 05/17/2022 12:25 AM

## 2022-05-17 NOTE — ED Notes (Addendum)
Pt is talking with pressured speech, getting loud at times, and sprinkled with cuss words

## 2022-05-17 NOTE — Consult Note (Signed)
[facility] ED ASSESSMENT   Reason for Consult:  psychosis Referring Physician:  Cherlynn June, PA Patient Identification: Erickson Kocian MRN:  [id number] ED Chief Complaint: Schizoaffective disorder, bipolar type [facility])  Diagnosis:  Principal Problem:   Schizoaffective disorder, bipolar type ([place]) Active Problems:   Agitation   ED Assessment Time Calculation: Start Time: 1130 Stop Time: 1200 Total Time in Minutes (Assessment Completion): 30   Subjective:   Andrew Lindsey is a 41 y.o. male patient who originally presented to [facility] via law enforcement under IVC. He was in jail for 91 days and case was dismissed. He presented with disorganized thought, rapid speech, responding to internal stimuli, and aggression. Pt was transferred to [facility].   HPI:   Patient seen today at [facility] for face to face evaluation. Patient is laying down in his bed, staring at the ceiling. His speech continues to be rapid and mumbled, he is difficult to understand at times and is minimal in his responses. He appeared irritable, but did cooperate with most of assessment. He denies current suicidal or homicidal ideations. Denies auditory or visual hallucinations. However, nursing staff has documented patient responding to internal stimuli, and looking at the ceiling talking to "Nate." He states he previously took Abilify and Lithium and would like to restart those medications. He tells me he was in jail for assaulting his wife, who no longer is in communication with him. While in jail it is unclear if patient received psychotropic medications. It is documented that patient last received Aristada injection March 2nd 2023 with [facility]. It was recommended he receive his next dose on May 2nd 2023. It is unclear whether patient ever received this dose while in jail. Pt is not a reliable historian at this time. He does endorse good sleep and appetite. Pt did not wish to  continue the assessment any further.   Around 12:30pm patient became agitated, cursing and yelling at ED staff. It is documented that pt was responding to internal stimuli, exposed his genitalia to staff, and took his clothes off asking for security to search him while nude. Patient given one time does of Geodon 20mg  IM for agitation and psychosis. Will restart Abilify at 10mg  po daily. Pt refused first dose today.   Past Psychiatric History:  Previous hx of BH follow up and ED encounters for aggression and psychotropic med management Patient has received prior inpatient psychiatric treatment  Risk to Self or Others: Is the patient at risk to self? Yes Has the patient been a risk to self in the past 6 months? No Has the patient been a risk to self within the distant past? No Is the patient a risk to others? Yes Has the patient been a risk to others in the past 6 months? Yes Has the patient been a risk to others within the distant past? Yes  [place] Scale:  [place] [facility] from 05/16/2022 in [place] [facility] from 11/11/2021 in [place] [facility] from 11/06/2021 in [place] CATEGORY High Risk No Risk No Risk        Past Medical History:  Past Medical History:  Diagnosis Date   Bipolar 1 disorder ([place])    Diabetes mellitus without complication ([place])    Hypercholesteremia    Tourette's    History reviewed. No pertinent surgical history. Family History: History reviewed. No pertinent family history.  Social History:  Social History   Substance and Sexual Activity  Alcohol Use Not Currently     Social History   Substance and Sexual Activity  Drug Use Never    Social History   Socioeconomic History   Marital status: Married    Spouse name: Not on file   Number of children: Not on file   Years of education: Not on file   Highest education level: Not on file   Occupational History   Not on file  Tobacco Use   Smoking status: Never   Smokeless tobacco: Never  Vaping Use   Vaping Use: Never used  Substance and Sexual Activity   Alcohol use: Not Currently   Drug use: Never   Sexual activity: Not on file  Other Topics Concern   Not on file  Social History Narrative   Not on file   Social Determinants of Health   Financial Resource Strain: Not on file  Food Insecurity: Not on file  Transportation Needs: Not on file  Physical Activity: Not on file  Stress: Not on file  Social Connections: Not on file   Additional Social History:    Allergies:   Allergies  Allergen Reactions   Pravastatin Other (See Comments)    Labs:  Results for orders placed or performed during the hospital encounter of 05/16/22 (from the past 48 hour(s))  Urine rapid drug screen (hosp performed)     Status: None   Collection Time: 05/16/22  9:28 PM  Result Value Ref Range   Opiates NONE DETECTED NONE DETECTED   Cocaine NONE DETECTED NONE DETECTED   Benzodiazepines NONE DETECTED NONE DETECTED   Amphetamines NONE DETECTED NONE DETECTED   Tetrahydrocannabinol NONE DETECTED NONE DETECTED   Barbiturates NONE DETECTED NONE DETECTED    Comment: (NOTE) DRUG SCREEN FOR MEDICAL PURPOSES ONLY.  IF CONFIRMATION IS NEEDED FOR ANY PURPOSE, NOTIFY LAB WITHIN 5 DAYS.  LOWEST DETECTABLE LIMITS FOR URINE DRUG SCREEN Drug Class                     Cutoff (ng/mL) Amphetamine and metabolites    1000 Barbiturate and metabolites    200 Benzodiazepine                 200 Tricyclics and metabolites     300 Opiates and metabolites        300 Cocaine and metabolites        300 THC                            50 Performed at [facility], [address], [place], [place] [address]   Resp Panel by RT-PCR (Flu A&B, Covid) Anterior Nasal Swab     Status: None   Collection Time: 05/16/22  9:31 PM   Specimen: Anterior Nasal Swab  Result Value Ref Range   SARS  Coronavirus 2 by RT PCR NEGATIVE NEGATIVE    Comment: (NOTE) SARS-CoV-2 target nucleic acids are NOT DETECTED.  The SARS-CoV-2 RNA is generally detectable in upper respiratory specimens during the acute phase of infection. The lowest concentration of SARS-CoV-2 viral copies this assay can detect is 138 copies/mL. A negative result does not preclude SARS-Cov-2 infection and should not be used as the sole basis for treatment or other patient management decisions. A negative result may occur with  improper specimen collection/handling, submission of specimen other than nasopharyngeal swab, presence of viral mutation(s) within the areas  targeted by this assay, and inadequate number of viral copies(<138 copies/mL). A negative result must be combined with clinical observations, patient history, and epidemiological information. The expected result is Negative.  Fact Sheet for Patients:  BloggerCourse.com  Fact Sheet for Healthcare Providers:  SeriousBroker.it  This test is no t yet approved or cleared by the [place] FDA and  has been authorized for detection and/or diagnosis of SARS-CoV-2 by FDA under an Emergency Use Authorization (EUA). This EUA will remain  in effect (meaning this test can be used) for the duration of the COVID-19 declaration under Section 564(b)(1) of the Act, 21 U.S.C.section 360bbb-3(b)(1), unless the authorization is terminated  or revoked sooner.       Influenza A by PCR NEGATIVE NEGATIVE   Influenza B by PCR NEGATIVE NEGATIVE    Comment: (NOTE) The Xpert Xpress SARS-CoV-2/FLU/RSV plus assay is intended as an aid in the diagnosis of influenza from Nasopharyngeal swab specimens and should not be used as a sole basis for treatment. Nasal washings and aspirates are unacceptable for Xpert Xpress SARS-CoV-2/FLU/RSV testing.  Fact Sheet for Patients: BloggerCourse.com  Fact Sheet  for Healthcare Providers: SeriousBroker.it  This test is not yet approved or cleared by the [place] FDA and has been authorized for detection and/or diagnosis of SARS-CoV-2 by FDA under an Emergency Use Authorization (EUA). This EUA will remain in effect (meaning this test can be used) for the duration of the COVID-19 declaration under Section 564(b)(1) of the Act, 21 U.S.C. section 360bbb-3(b)(1), unless the authorization is terminated or revoked.  Performed at [facility], [address], [place], [place] [address]   Comprehensive metabolic panel     Status: Abnormal   Collection Time: 05/16/22  9:35 PM  Result Value Ref Range   Sodium 139 135 - 145 mmol/L   Potassium 3.5 3.5 - 5.1 mmol/L   Chloride 107 98 - 111 mmol/L   CO2 24 22 - 32 mmol/L   Glucose, Bld 149 (H) 70 - 99 mg/dL    Comment: Glucose reference range applies only to samples taken after fasting for at least 8 hours.   BUN 11 6 - 20 mg/dL   Creatinine, Ser 6.04 0.61 - 1.24 mg/dL   Calcium 9.0 8.9 - 54.0 mg/dL   Total Protein 6.4 (L) 6.5 - 8.1 g/dL   Albumin 3.9 3.5 - 5.0 g/dL   AST 14 (L) 15 - 41 U/L   ALT 12 0 - 44 U/L   Alkaline Phosphatase 74 38 - 126 U/L   Total Bilirubin 0.6 0.3 - 1.2 mg/dL   GFR, Estimated >98 >11 mL/min    Comment: (NOTE) Calculated using the CKD-EPI Creatinine Equation (2021)    Anion gap 8 5 - 15    Comment: Performed at [facility], [address] [place], [place] [address]  Ethanol     Status: None   Collection Time: 05/16/22  9:35 PM  Result Value Ref Range   Alcohol, Ethyl (B) <10 <10 mg/dL    Comment: (NOTE) Lowest detectable limit for serum alcohol is 10 mg/dL.  For medical purposes only. Performed at [facility], [address] [place], [place] [address]   CBC with Diff     Status: Abnormal   Collection Time: 05/16/22  9:35 PM  Result Value Ref Range   WBC 10.1 4.0 - 10.5 K/uL   RBC 4.49 4.22 - 5.81 MIL/uL    Hemoglobin 12.8 (L) 13.0 - 17.0 g/dL  HCT 39.1 39.0 - 52.0 %   MCV 87.1 80.0 - 100.0 fL   MCH 28.5 26.0 - 34.0 pg   MCHC 32.7 30.0 - 36.0 g/dL   RDW 13.2 11.5 - 15.5 %   Platelets 364 150 - 400 K/uL   nRBC 0.0 0.0 - 0.2 %   Neutrophils Relative % 73 %   Neutro Abs 7.4 1.7 - 7.7 K/uL   Lymphocytes Relative 18 %   Lymphs Abs 1.8 0.7 - 4.0 K/uL   Monocytes Relative 5 %   Monocytes Absolute 0.5 0.1 - 1.0 K/uL   Eosinophils Relative 3 %   Eosinophils Absolute 0.3 0.0 - 0.5 K/uL   Basophils Relative 1 %   Basophils Absolute 0.1 0.0 - 0.1 K/uL   Immature Granulocytes 0 %   Abs Immature Granulocytes 0.03 0.00 - 0.07 K/uL    Comment: Performed at [facility] [address], [place], [place] [address]    Current Facility-Administered Medications  Medication Dose Route Frequency Provider Last Rate Last Admin   acetaminophen (TYLENOL) tablet 650 mg  650 mg Oral Q4H PRN Dorothyann Peng, PA-C       [START ON 05/18/2022] ARIPiprazole (ABILIFY) tablet 10 mg  10 mg Oral Daily Vesta Mixer, NP       LORazepam (ATIVAN) tablet 1 mg  1 mg Oral BID PRN Vesta Mixer, NP       OLANZapine zydis (ZYPREXA) disintegrating tablet 10 mg  10 mg Oral BID PRN Vesta Mixer, NP       ondansetron (ZOFRAN) tablet 4 mg  4 mg Oral Q8H PRN Dorothyann Peng, PA-C       Current Outpatient Medications  Medication Sig Dispense Refill   benztropine (COGENTIN) 2 MG tablet Take 2 mg by mouth 2 (two) times daily.     divalproex (DEPAKOTE) 250 MG DR tablet Take 500 mg by mouth 2 (two) times daily.     fluPHENAZine (PROLIXIN) 10 MG tablet Take 15 mg by mouth at bedtime.     glimepiride (AMARYL) 1 MG tablet Take 2 mg by mouth daily with breakfast.     PARoxetine (PAXIL) 10 MG tablet Take 10 mg by mouth at bedtime. (Patient not taking: Reported on 11/11/2021)     simvastatin (ZOCOR) 10 MG tablet Take 10 mg by mouth daily.       Psychiatric Specialty Exam: Presentation  General Appearance:  Disheveled  Eye Contact:Minimal  Speech:Garbled  Speech Volume:Normal  Handedness:Right   Mood and Affect  Mood:Irritable  Affect:Flat   Thought Process  Thought Processes:Disorganized  Descriptions of Associations:Circumstantial  Orientation:Full (Time, Place and Person)  Thought Content:Logical  History of Schizophrenia/Schizoaffective disorder:No (Pt said he has been dx w/schizophrenia; wife disputed)  Duration of Psychotic Symptoms:Less than six months  Hallucinations:Hallucinations: Auditory  Ideas of Reference:None  Suicidal Thoughts:Suicidal Thoughts: No  Homicidal Thoughts:Homicidal Thoughts: No   Sensorium  Memory:Immediate Fair  Judgment:Impaired  Insight:Lacking   Executive Functions  Concentration:Fair  Attention Span:Fair  [place]   Psychomotor Activity  Psychomotor Activity:Psychomotor Activity: Normal   Assets  Assets:Physical Health; Social Support    Sleep  Sleep:Sleep: Fair   Physical Exam: Physical Exam Neurological:     Mental Status: He is alert.  Psychiatric:        Mood and Affect: Affect is labile.        Behavior: Behavior is cooperative.        Judgment: Judgment is impulsive.  Review of Systems  Psychiatric/Behavioral:  Positive for hallucinations.        Agitation, responding to internal stimuli  All other systems reviewed and are negative.  Blood pressure 102/60, pulse 93, temperature 97.9 F (36.6 C), temperature source Oral, resp. rate 18, SpO2 99 %. There is no height or weight on file to calculate BMI.  Medical Decision Making: Recommended that patient receive inpatient psychiatric care.  Zyprexa 10mg  po and Lorazepam 1mg  po added twice daily as needed for agitation Abilify 10mg  po daily started  Problem 1: Schizoaffective, bipolar type -Started Abilify 10mg  po daily -PRN medications for agitation ordered -Continue to search for inpatient  psychiatric treatment placement   Disposition: Recommend psychiatric Inpatient admission when medically cleared. Case reviewed and discussed with Dr. Serafina Mitchell. Will recommend psychiatric inpatient treatment. Pt currently meets criteria for IVC. No current available beds at [facility]. Psychiatry CSW notified to seek placement. RN, LCSW, and EDP notified of disposition.   Vesta Mixer, NP 05/17/2022 1:36 PM

## 2022-05-17 NOTE — ED Notes (Addendum)
Pt is in room and has been ranting to himself and cursing in angry tone for past 15 mins. These behaviors seem to be escalating. His speech is nonsensical and loud and pressured.

## 2022-05-17 NOTE — ED Notes (Addendum)
Pt woke up and is eating his meal tray. RN asked him if he would take his medicine now and he agreed to. RN consulted ED pharm to get it ambilify rescheduled to now.

## 2022-05-17 NOTE — Progress Notes (Signed)
Pt is naked screaming strip search

## 2022-05-17 NOTE — ED Notes (Signed)
Pt is refusing meds at this time

## 2022-05-17 NOTE — Progress Notes (Signed)
Patient has been denied by [facility] due to no appropriate beds available. Patient meets BH inpatient criteria per Eligha Bridegroom, NP. Patient has been faxed out to the following facilities:   [facility]  [address] [place] [address] [phone]  [facility]  [address] [place] [address] [phone]  [facility]  [address], [place] [address] [phone]  [facility]  [address] [place] [address] [phone]  [facility]  [address] [place], [place] [address] [phone]  [facility]  [address] [place], [place] [address] [phone]  [facility]  [address] [place] [address] [phone]  [facility]  [address] [place] [address] [phone]  [facility]  [address], [place] [address] [phone]  [facility]  [address], [place] [address] [phone]   Damita Dunnings, MSW, LCSW-A  1:57 PM 05/17/2022

## 2022-05-17 NOTE — Progress Notes (Signed)
Pt came out of room and told staff to suck it and pulled his penis out

## 2022-05-18 MED ORDER — SIMVASTATIN 20 MG PO TABS
10.0000 mg | ORAL_TABLET | Freq: Every evening | ORAL | Status: DC
  Administered 2022-05-18 – 2022-05-29 (×11): 10 mg via ORAL
  Filled 2022-05-18 (×11): qty 1

## 2022-05-18 MED ORDER — ZIPRASIDONE MESYLATE 20 MG IM SOLR
20.0000 mg | Freq: Once | INTRAMUSCULAR | Status: AC
  Administered 2022-05-18: 20 mg via INTRAMUSCULAR
  Filled 2022-05-18: qty 20

## 2022-05-18 MED ORDER — METFORMIN HCL 500 MG PO TABS
500.0000 mg | ORAL_TABLET | Freq: Every evening | ORAL | Status: DC
  Administered 2022-05-18 – 2022-05-29 (×11): 500 mg via ORAL
  Filled 2022-05-18 (×12): qty 1

## 2022-05-18 MED ORDER — ZIPRASIDONE MESYLATE 20 MG IM SOLR
20.0000 mg | Freq: Once | INTRAMUSCULAR | Status: DC
  Filled 2022-05-18: qty 20

## 2022-05-18 MED ORDER — PROPRANOLOL HCL 10 MG PO TABS
10.0000 mg | ORAL_TABLET | Freq: Two times a day (BID) | ORAL | Status: DC
  Administered 2022-05-18 – 2022-05-30 (×21): 10 mg via ORAL
  Filled 2022-05-18 (×29): qty 1

## 2022-05-18 MED ORDER — STERILE WATER FOR INJECTION IJ SOLN
INTRAMUSCULAR | Status: AC
  Filled 2022-05-18: qty 10

## 2022-05-18 MED ORDER — MIDAZOLAM HCL (PF) 10 MG/2ML IJ SOLN: 5.0000 mg | Freq: Once | INTRAMUSCULAR | Status: DC

## 2022-05-18 MED ORDER — MIDAZOLAM HCL (PF) 5 MG/ML IJ SOLN
5.0000 mg | Freq: Once | INTRAMUSCULAR | Status: AC
Start: 2022-05-18 — End: 2022-05-18
  Administered 2022-05-18: 5 mg via INTRAMUSCULAR
  Filled 2022-05-18: qty 1

## 2022-05-18 NOTE — ED Notes (Signed)
Pt up to doorway asking how long is he going to be here and stating that he needs to go home or taking him to jail. Back to bed

## 2022-05-18 NOTE — ED Provider Notes (Signed)
Emergency Medicine Observation Re-evaluation Note  Laithan Conchas is a 41 y.o. male, seen on rounds today.  Pt initially presented to the ED for complaints of IVC Currently, the patient is sleeping, was given IM Geodon earlier this morning.  Physical Exam  BP 111/77 (BP Location: Right Arm)   Pulse 99   Temp 98.1 F (36.7 C) (Oral)   Resp 18   SpO2 96%  Physical Exam General: Asleep Cardiac: Not assessed Lungs: Snoring, normal effort Psych: Not assessed  ED Course / MDM  EKG:   I have reviewed the labs performed to date as well as medications administered while in observation.  Recent changes in the last 24 hours include psychiatry restarting Abilify.  I have ordered other home medicines though it does not seem like they wanted to restart the lithium.  Plan  Current plan is for inpatient placement. Zandon Talton is under involuntary commitment.      Pricilla Loveless, MD 05/18/22 1235

## 2022-05-18 NOTE — ED Provider Notes (Signed)
Patient is becoming agitated and becoming aggressive towards police.  He will need IM Geodon, which she last received around 10 AM.   Pricilla Loveless, MD 05/18/22 [phone]

## 2022-05-18 NOTE — ED Provider Notes (Signed)
Nursing reports that patient is getting agitated and is becoming a threat to others in the emergency department at this time.  He is under IVC.  Chart review shows that he was given Geodon yesterday for agitation.  Review of previous EKG does not show prolonged QTc we will give him another dose of Geodon.   Inez Rosato, Canary Brim, MD 05/18/22 [phone]

## 2022-05-18 NOTE — ED Notes (Signed)
Report to Linda, RN

## 2022-05-18 NOTE — ED Notes (Signed)
Noted security at the door with pt at this time pt is yelling and screaming. Unable to comprehend what pt is saying. Orders in for medication. Pt was medicated at this time

## 2022-05-18 NOTE — ED Notes (Signed)
Attempted to complete assessment on pt however he wouldn't answer questions and when he did attempt to answer this RN was unable to understand his words sounded like garble

## 2022-05-18 NOTE — ED Notes (Signed)
Received verbal report from Sh[place] P RN at this time 

## 2022-05-18 NOTE — ED Notes (Signed)
Pt awake and talking while laying in the bed. Unable to understand what he is saying due to fast speech.

## 2022-05-18 NOTE — ED Notes (Signed)
Pt started cursing and acting aggressive towards staff.Security at bedside.  MD notified. Versed and Geodon ordered.

## 2022-05-19 ENCOUNTER — Encounter (HOSPITAL_COMMUNITY): Payer: Self-pay | Admitting: Registered Nurse

## 2022-05-19 DIAGNOSIS — R451 Restlessness and agitation: Secondary | ICD-10-CM

## 2022-05-19 LAB — LITHIUM LEVEL: Lithium Lvl: 0.07 mmol/L — ABNORMAL LOW (ref 0.60–1.20)

## 2022-05-19 MED ORDER — LORAZEPAM 2 MG/ML IJ SOLN
1.0000 mg | Freq: Once | INTRAMUSCULAR | Status: AC
  Administered 2022-05-19: 1 mg via INTRAMUSCULAR
  Filled 2022-05-19: qty 1

## 2022-05-19 MED ORDER — STERILE WATER FOR INJECTION IJ SOLN
INTRAMUSCULAR | Status: AC
  Administered 2022-05-19: 10 mL
  Filled 2022-05-19: qty 10

## 2022-05-19 MED ORDER — ARIPIPRAZOLE 10 MG PO TABS
20.0000 mg | ORAL_TABLET | Freq: Every day | ORAL | Status: DC
  Administered 2022-05-20 – 2022-05-30 (×11): 20 mg via ORAL
  Filled 2022-05-19 (×11): qty 2

## 2022-05-19 MED ORDER — HYDROXYZINE HCL 25 MG PO TABS
25.0000 mg | ORAL_TABLET | Freq: Three times a day (TID) | ORAL | Status: DC | PRN
  Administered 2022-05-20 – 2022-05-24 (×4): 25 mg via ORAL
  Filled 2022-05-19 (×4): qty 1

## 2022-05-19 MED ORDER — LORAZEPAM 2 MG/ML IJ SOLN
3.0000 mg | Freq: Once | INTRAMUSCULAR | Status: AC
  Administered 2022-05-19: 3 mg via INTRAMUSCULAR
  Filled 2022-05-19: qty 2

## 2022-05-19 MED ORDER — STERILE WATER FOR INJECTION IJ SOLN
INTRAMUSCULAR | Status: AC
  Administered 2022-05-19: 1.2 mL
  Filled 2022-05-19: qty 10

## 2022-05-19 MED ORDER — ZIPRASIDONE MESYLATE 20 MG IM SOLR
10.0000 mg | Freq: Once | INTRAMUSCULAR | Status: AC
  Administered 2022-05-19: 10 mg via INTRAMUSCULAR
  Filled 2022-05-19: qty 20

## 2022-05-19 MED ORDER — LITHIUM CARBONATE ER 300 MG PO TBCR
300.0000 mg | EXTENDED_RELEASE_TABLET | Freq: Two times a day (BID) | ORAL | Status: DC
  Administered 2022-05-19 – 2022-05-22 (×7): 300 mg via ORAL
  Filled 2022-05-19 (×7): qty 1

## 2022-05-19 MED ORDER — ZIPRASIDONE MESYLATE 20 MG IM SOLR
20.0000 mg | Freq: Once | INTRAMUSCULAR | Status: AC
Start: 2022-05-19 — End: 2022-05-19

## 2022-05-19 MED ORDER — ZIPRASIDONE MESYLATE 20 MG IM SOLR
INTRAMUSCULAR | Status: AC
  Administered 2022-05-19: 20 mg via INTRAMUSCULAR
  Filled 2022-05-19: qty 20

## 2022-05-19 MED ORDER — TRAZODONE HCL 100 MG PO TABS
100.0000 mg | ORAL_TABLET | Freq: Every evening | ORAL | Status: DC | PRN
  Administered 2022-05-19 – 2022-05-29 (×8): 100 mg via ORAL
  Filled 2022-05-19 (×8): qty 1

## 2022-05-19 NOTE — ED Notes (Signed)
Pt standing up peeking out of the door. Pt noticed this RN to look up at  him and he went back into his room. Pt now laying in bed talking to himself loudly and fast speech unable to understand what he is saying.. This RN is at bedside attempting to redirect pt and get pt to talk in a lower voice if he is going to talk. Pt tried to bargain this RN that if he could get a sandwich he would stop. This RN advised him no he wasn't getting one

## 2022-05-19 NOTE — ED Notes (Signed)
Patient being verbally aggressive towards persons not in the room. Sitter attempted to partially close door and patient yelled at her to not come any closer. RN approached threshold of patients room and attempted to provide PO meds and patient states to call security and that he will not take them. Patient states to the nurse that he will start hurting people and to call security. This RN clarified with patient by asking, "Are you saying that you will start hurting people?" The patient responded yes. The RN stepped away from the threshold and requested orders from the ED provider.

## 2022-05-19 NOTE — ED Notes (Signed)
Security at bedside and pt medicated at this time

## 2022-05-19 NOTE — ED Notes (Signed)
Pt continues to lay in bed talking very loudly and fast

## 2022-05-19 NOTE — Progress Notes (Signed)
Per Eligha Bridegroom, NP, patient meets criteria for inpatient treatment. There are no available beds at [facility] today. CSW faxed referrals to the following facilities for review:  [facility]  Pending - Request Sent N/A [address] [place], [place] [address] [phone] --  [facility]  Pending - Request Sent N/A [address] [place] [address] [phone] --  [facility]  Pending - Request Sent N/A [address] [place] [address] [phone] --  [facility]  Pending - Request Sent N/A [address] [place] [address] [phone] --  [facility]  Pending - Request Sent N/A [address] [place], [place] [address] [phone] --  [facility]  Pending - Request Sent N/A [address] [place] [address] [phone] --  [facility]  Pending - Request Sent N/A [phone] [place] [address] [phone] --  [facility]  Pending - Request Sent N/A [address] [place] [address] [phone] --  [facility]  Pending - Request Sent N/A [address] [place] [address] [phone] --  [facility]  Pending - Request Sent N/A [address] [place] [address] [phone] --  [facility]  Pending - No Request Sent N/A [address], [place] [address] [phone] --  [facility]  Pending - No Request Sent N/A [address] [place] [address] [phone] --  [facility]  Pending - No Request Sent N/A [address] [address] [place] [address] [phone] --  [facility]  Pending - No Request Sent  N/A [address] [place] [address] [phone] --  [facility]  Pending - No Request Sent N/A [address] [place] [address] [phone] --  [facility]  Pending - No Request Sent N/A [address] [place] [address] [phone] --  [facility]  Pending - No Request Sent N/A [address], [place] [address] [phone] --   TTS will continue to seek bed placement.  Crissie Reese, MSW, Lenice Pressman Phone: [phone] Disposition/TOC

## 2022-05-19 NOTE — ED Provider Notes (Addendum)
Emergency Medicine Observation Re-evaluation Note  Andrew Lindsey is a 41 y.o. male, seen on rounds today.  Pt initially presented to the ED for complaints of hx schizoaffective disorder, was in jail for ~ 3 months, due to overcrowding issues, patient was d/c from jail and promptly IVC'd by [facility] and sent to ED.  This AM, pt denies any new c/o, no physical c/o.   Physical Exam  BP 123/86 (BP Location: Right Arm)   Pulse 94   Temp 98.2 F (36.8 C) (Axillary)   Resp 20   SpO2 96%  Physical Exam General: alert, content, nad.  Cardiac: regular rate.  Lungs: breathing comfortably Psych: alert, conversant. Normal mood and affect - pt does not appear acutely depressed or despondent. No SI/HI.  Pt currently does not appear to be responding to internal stimuli - no acute delusions or hallucinations noted.   ED Course / MDM    I have reviewed the labs performed to date as well as medications administered while in observation.  Recent changes in the last 24 hours include ED obs, med management, BH assessment.   Plan  After re-starting pts meds, it appear patient likey close to his baseline.   Attempted to reach pts 'ex' for additional info, but numbers listed have been changed, and not able to reach.    Will ask BH team to reassess, ?set up with outpatient resources and follow up plan, ?community case manager/act, etc.   [name], MD 05/19/22 1235  Pt with period agitated behavior, at which point appear to be responding to internal stimuli, delusional. Reassurance provided by staff. Pt given geodon and ativan for symptom improvement.   On review record, it appears [facility] team is adjusting/new dose of abilify starting in AM tomorrow.  On recheck, pt is calm, breathing comfortably, no distress.       [name], MD 05/19/22 [phone]

## 2022-05-19 NOTE — ED Notes (Signed)
Spoke with Dr. Posey Rea and received verbal orders for medication. Security called to purple at this time to assist with medicating pt.

## 2022-05-19 NOTE — ED Notes (Signed)
Patients dinner arrived, patient invited to eat however currently sleeping at this time and did not wake to eat or take meds

## 2022-05-19 NOTE — ED Notes (Signed)
Pt out of room ambulating to restroom

## 2022-05-19 NOTE — ED Notes (Signed)
Pt still laying in bed yelling and screaming using foul language and is now spitting all over the room. Security remains in the dept at this time

## 2022-05-19 NOTE — ED Notes (Signed)
Verbal report given to April O RN at this time 

## 2022-05-19 NOTE — ED Notes (Signed)
Pt awake at this time laying in bed yelling out that he was hungry and starving. Pt advised that breakfast would be served around 8am and that is when he could have something to drink. At this time pt is sitting up on side of bed at this time continuing to talk to himself at this time

## 2022-05-19 NOTE — Consult Note (Signed)
Telepsych Consultation   Reason for Consult:  Psychiatric reassessment Referring Physician:  Pamala Duffel  Location of Patient: [facility] ED Location of Provider: Other: Home Office  Patient Identification: Andrew Lindsey MRN:  [id number] Principal Diagnosis: Schizoaffective disorder, bipolar type (HCC) Diagnosis:  Principal Problem:   Schizoaffective disorder, bipolar type (HCC) Active Problems:   Agitation   Total Time spent with patient: 30 minutes  Subjective:   Andrew Lindsey is a 41 y.o. male patient with a psychiatric history of Schizoaffective disorder bipolar type, obsessive compulsive disorder, and Tourette's was admitted to [facility] ED after being transferred from [facility] where he initially presented on 05/16/22 via law enforcement under IVC.   Per IVC:  "[name] has been in the [facility] jail for 91 days.  [facility] but [facility] were [facility] available.  His court case was dismissed today.  And he is not in a mental state to take care of himself upon release.  He is unaware of his surroundings and does not have a clear picture of where he is.  He is having conversations with people that are not there.  He does suffer from schizoaffective disorder with mood stability and hallucinations.  While in jail he has refused to take his medication at times."  HPI:  Andrew Lindsey, 41 y.o., male patient seen via tele health by this provider, consulted with Dr. Margaretha Seeds; and chart reviewed on 05/19/22.  On evaluation Andrew Lindsey reports he was brought to the hospital because he has schizophrenia, bipolar, and tourette's.  States he was brought from jail.  Patient states that while in jail he was hearing the voices of certain people but couldn't say what the voices were saying.  When asked if he was taking his medications while in jail he states he doesn't remember.  Patient states that he was getting the Aristada  injection but can't remember the last time he had the injection.  Patient also states that he never follow up with [facility] for outpatient psychiatric services.  Patient reports he has been homeless for 3 1/2 months.  He attempted to tell what happened between him and his wife for him to become homeless but it was difficulty to understand.  Patient states that he wants to go to [facility] hospital "I like the doctor there.  She is very nice."     During evaluation Andrew Lindsey is sitting up in bed with no noted distress.  He is alert/oriented x 4.  He was able to give correct answers to question on DOB, age, current place, city, state, month, date, year, current president, and last 2 presidents prior to current.   He is calm and cooperative throughout assessment.  His mood is euthymic and somewhat pleasant with congruent affect at time of assessment.  He is speaking at a normal pace but speech is somewhat garbled and difficult to understand at times.  He has good eye contact. His thought process is coherent and relevant; There is no indication that he is currently responding to internal/external stimuli or experiencing delusional thought content other than his endorsement that he is hearing voices.  He does not elaborate on what voices are saying only that "I hear them."  He denies suicidal/self-harm/homicidal ideation, and paranoia.    Chart review noted that patient continues to have episodes of talking to himself, yelling out and cursing staff.  He has had to be medicated several times for agitation.  Will continue to recommend inpatient psychiatric treatment.     Past Psychiatric History: psychiatric history of Schizoaffective disorder bipolar type, obsessive compulsive disorder, and Tourette's   Risk to Self:  Denies Risk to Others:  Denies Prior Inpatient Therapy:  Yes Prior Outpatient Therapy:  Yes  Past Medical History:  Past Medical History:  Diagnosis Date   Bipolar 1 disorder (HCC)     Diabetes mellitus without complication (HCC)    Hypercholesteremia    Tourette's    History reviewed. No pertinent surgical history. Family History: History reviewed. No pertinent family history. Family Psychiatric  History: None reported Social History:  Social History   Substance and Sexual Activity  Alcohol Use Not Currently     Social History   Substance and Sexual Activity  Drug Use Never    Social History   Socioeconomic History   Marital status: Married    Spouse name: Not on file   Number of children: Not on file   Years of education: Not on file   Highest education level: Not on file  Occupational History   Not on file  Tobacco Use   Smoking status: Never   Smokeless tobacco: Never  Vaping Use   Vaping Use: Never used  Substance and Sexual Activity   Alcohol use: Not Currently   Drug use: Never   Sexual activity: Not on file  Other Topics Concern   Not on file  Social History Narrative   Not on file   Social Determinants of Health   Financial Resource Strain: Not on file  Food Insecurity: Not on file  Transportation Needs: Not on file  Physical Activity: Not on file  Stress: Not on file  Social Connections: Not on file   Additional Social History:    Allergies:   Allergies  Allergen Reactions   Pravastatin Other (See Comments)    Unknown reaction    Labs: No results found for this or any previous visit (from the past 48 hour(s)).  Medications:  Current Facility-Administered Medications  Medication Dose Route Frequency Provider Last Rate Last Admin   acetaminophen (TYLENOL) tablet 650 mg  650 mg Oral Q4H PRN Barrie Dunker B, PA-C       ARIPiprazole (ABILIFY) tablet 10 mg  10 mg Oral Daily Eligha Bridegroom, NP   10 mg at 05/19/22 0930   LORazepam (ATIVAN) tablet 1 mg  1 mg Oral BID PRN Eligha Bridegroom, NP   1 mg at 05/17/22 1834   metFORMIN (GLUCOPHAGE) tablet 500 mg  500 mg Oral QPM Pricilla Loveless, MD   500 mg at 05/18/22 1746    OLANZapine zydis (ZYPREXA) disintegrating tablet 10 mg  10 mg Oral BID PRN Eligha Bridegroom, NP   10 mg at 05/17/22 2012   ondansetron (ZOFRAN) tablet 4 mg  4 mg Oral Q8H PRN Darrick Grinder, PA-C       propranolol (INDERAL) tablet 10 mg  10 mg Oral BID Pricilla Loveless, MD   10 mg at 05/19/22 0930   simvastatin (ZOCOR) tablet 10 mg  10 mg Oral QPM Pricilla Loveless, MD   10 mg at 05/18/22 1746   Current Outpatient Medications  Medication Sig Dispense Refill   glipiZIDE (GLUCOTROL) 5 MG tablet Take 5 mg by mouth every evening.     lithium carbonate (ESKALITH) 450 MG CR tablet Take 900 mg by mouth every evening.     metFORMIN (GLUCOPHAGE) 500 MG tablet Take 500 mg by mouth every evening.     propranolol (INDERAL)  10 MG tablet Take 10 mg by mouth 2 (two) times daily.     simvastatin (ZOCOR) 10 MG tablet Take 10 mg by mouth every evening.      Musculoskeletal: Strength & Muscle Tone: within normal limits Gait & Station: normal Patient leans: N/A   Psychiatric Specialty Exam:  Presentation  General Appearance: Appropriate for Environment  Eye Contact:Good  Speech:Garbled  Speech Volume:Normal  Handedness:Right   Mood and Affect  Mood:Euthymic  Affect:Appropriate; Congruent   Thought Process  Thought Processes:Coherent; Linear  Descriptions of Associations:Circumstantial  Orientation:Full (Time, Place and Person)  Thought Content:Logical  History of Schizophrenia/Schizoaffective disorder:No (Pt said he has been dx w/schizophrenia; wife disputed)  Duration of Psychotic Symptoms:Less than six months  Hallucinations:Hallucinations: Auditory Description of Auditory Hallucinations: Reports he is hearing voice but no elaboration on what voices are saying  Ideas of Reference:None  Suicidal Thoughts:Suicidal Thoughts: No  Homicidal Thoughts:Homicidal Thoughts: No   Sensorium  Memory:Immediate Fair; Recent Good  Judgment:Fair  Insight:Lacking   Executive  Functions  Concentration:Fair  Attention Span:Fair  Recall:Fair  Fund of Knowledge:Fair  Language:Fair   Psychomotor Activity  Psychomotor Activity:Psychomotor Activity: Normal   Assets  Assets:Desire for Improvement; Leisure Time   Sleep  Sleep:Sleep: Fair    Physical Exam: Physical Exam Vitals and nursing note reviewed. Exam conducted with a chaperone present.  Constitutional:      General: He is not in acute distress.    Appearance: Normal appearance. He is not ill-appearing.  Cardiovascular:     Rate and Rhythm: Normal rate.  Pulmonary:     Effort: Pulmonary effort is normal.  Neurological:     Mental Status: He is alert and oriented to person, place, and time.  Psychiatric:        Attention and Perception: He perceives auditory hallucinations.        Mood and Affect: Mood normal.        Speech: Slurred: Garbled.  Possible normal speach pattern.        Behavior: Behavior is uncooperative.        Thought Content: Thought content is not paranoid or delusional. Thought content does not include homicidal or suicidal ideation.        Judgment: Judgment is impulsive.    Review of Systems  Constitutional: Negative.   HENT: Negative.    Eyes: Negative.   Respiratory: Negative.    Cardiovascular: Negative.   Gastrointestinal: Negative.   Genitourinary: Negative.   Musculoskeletal: Negative.   Skin: Negative.   Neurological: Negative.   Endo/Heme/Allergies: Negative.   Psychiatric/Behavioral:  Depression: Stable. Hallucinations: States he is hearing voices. Suicidal ideas: Denies. The patient has insomnia. Nervous/anxious: stable.   Blood pressure 123/86, pulse 94, temperature 98.2 F (36.8 C), temperature source Axillary, resp. rate 20, SpO2 96 %. There is no height or weight on file to calculate BMI.  Treatment Plan Summary: Daily contact with patient to assess and evaluate symptoms and progress in treatment, Medication management, and Plan Psychiatric  hospitalization  Home medications prior to admission Aristada 1064 mg Q 8 weeks.  Last known injection was 04/06/2022 Lithium 300 mg Bid.  Last lithium level 1.0 on 01/24/22  Vistaril 50 mg Q 4 hrs. prn anxiety Ativan 1 mg Q 8 hrs. prn anxiety Propranolol 10 mg Bid Trazodone 100 mg Q hs prn sleep  Medication Management Lithium 300 mg Bid Vistaril 25 mg Tid Trazodone 100 mg Q hs prn Increased Abilify to 20 mg daily (equivalent to Aristada 1064 mg)  Last known  EKG Interpretation 11/11/2021.   Date/Time: 11/11/2021 at 12:41   Ventricular Rate: 105 BPM  Sinus tachycardia PR Interval: 146 ms    RSR' in V1 or V2, right VCD or RVH QRS Duration:  99 ms QT Interval: 354 ms   QTC Calculation:  468 ms   R Axis:   74 87 78    Text Interpretation:     Want to make sure rule out QTc prolongation related to the administration of antipsychotic for agitation.  Ordered EKG  Disposition: Recommend psychiatric Inpatient admission when medically cleared.  This service was provided via telemedicine using a 2-way, interactive audio and video technology.  Names of all persons participating in this telemedicine service and their role in this encounter. Name: Assunta Found Role: NP  Name: Andrew Lindsey Role: Patient  Name:  Role:   Name:  Role:    Secure message sent to patient's nurse, social work, and [facility] informing:  Psychiatric reassessment completed and continue to recommend inpatient psychiatric hospitalization.  Lithium level ordered unsure of last dose taken and started lithium at 300 mg bid.  Ordered EKG last know 11/11/2021 want to make sure no QTC prolongation with the administration of antipsychotics (for agitation and home medications).  Medication adjustments made.  Did not order Aristada 1064 mg.  His last recorded dose was 03/19/22 and was due to be given again around 05/14/22.  Increased Abilify to 20 mg daily which is equivalent to Aristada dose he was receiving.  Patient continues to  need inpatient psychiatric treatment.  If no available bed at [facility] patient will need to be re faxed out.  Please inform MD only default listed.    Kinleigh Nault, NP 05/19/2022 10:32 AM

## 2022-05-19 NOTE — ED Notes (Signed)
Pt laying in bed continuing to yell and speak with a rapid voice. Most of what pt is stating sounds like garbled words.

## 2022-05-19 NOTE — ED Notes (Signed)
Speaking with provider reference another pt while. While on the phone with the provider this pt started yelling and screaming louder. Per provider he would place new orders

## 2022-05-19 NOTE — ED Notes (Signed)
Pt up pacing from room to nurses station. Using loud voice and using aggressive behavior and making gun simulations with his hand towards officer at the nurses station

## 2022-05-20 ENCOUNTER — Encounter (HOSPITAL_COMMUNITY): Payer: Self-pay | Admitting: Nurse Practitioner

## 2022-05-20 MED ORDER — ZIPRASIDONE MESYLATE 20 MG IM SOLR
20.0000 mg | Freq: Once | INTRAMUSCULAR | Status: AC
Start: 2022-05-20 — End: 2022-05-20
  Administered 2022-05-20: 20 mg via INTRAMUSCULAR
  Filled 2022-05-20: qty 20

## 2022-05-20 MED ORDER — STERILE WATER FOR INJECTION IJ SOLN
INTRAMUSCULAR | Status: AC
  Administered 2022-05-20: 1.2 mL
  Filled 2022-05-20: qty 10

## 2022-05-20 NOTE — ED Notes (Signed)
Pt screaming and cussing in his room. This RN and staff witnessed pt fondling himself. Pt asked politely to stop. Pt now quiet.

## 2022-05-20 NOTE — ED Notes (Signed)
Pt resuming to scream. This RN attempted to give pt P.O PRN agitation meds, but pt refused and RN was told to, " Get the fuck out". NP Rankin and Mikyala made aware.

## 2022-05-20 NOTE — Progress Notes (Signed)
Inpatient Behavioral Health Placement  Pt meets inpatient criteria per Assunta Found, NP. There are no available beds at [facility] per [facility] Rona Ravens, RN.   Referral was sent to the following facilities;   Destination Service Provider Address Phone Fax  [facility]  [address] [place], [place] [address] [phone]  [facility]  [address], [place] [address] [phone]  [facility]  [address], [place] [address] [phone]  [facility]  [address] [place] [address] [phone]  [facility]  [address] [place], [place] [address] [phone]  [facility]  [address] [place], [place] [address] [phone]  [facility]  [address] [place] [address] [phone]  [facility]  [address] [place] [address] [phone]  [facility]  [address], [place] [address] [phone]  [facility]  [address] [place] [address] [phone]  [facility]  [address] [place] [address] [phone]  [facility]  [address] [place] [address] [phone]  [facility] [address] [place] [address] [phone]  [facility]  [address] [place] [address] [phone]  [facility]  [address] [place] [address] [phone]  [facility]  [address], [place] [address] [phone]  [facility]  [address], [place] [address]  [phone]  [facility]  [address] [place] [address] [phone]  [facility]  [address], [place] [address] [phone]    Situation ongoing,  CSW will follow up.   Maryjean Ka, MSW, LCSWA 05/20/2022  @ 1:03 PM

## 2022-05-20 NOTE — ED Notes (Signed)
Patient has been yelling not stop in the room and continues to escalate hitting side rail of bed; Patient is corporative when staff approaches regarding his yelling and noise but continues after staff exist the room; pt asked for med to help him sleep; All night meds given so PRN for agitation given-Monique,RN

## 2022-05-20 NOTE — ED Notes (Signed)
Pt increase talking to self and fidgeting. Pt endorses increase anxiousness. RN to give PRN atarax.

## 2022-05-20 NOTE — Consult Note (Signed)
[facility] Psych ED Progress Note  05/20/2022 3:51 PM [name]  MRN:  [id number]   Subjective:  Andrew Lindsey is a 41 y.o. male patient with a psychiatric history of Schizoaffective disorder bipolar type, obsessive compulsive disorder, and Tourette's was admitted to [facility] ED after being transferred from [facility] where he initially presented on 05/16/22 via law enforcement under IVC.   Per IVC:  "[name] has been in the [facility] jail for 91 days.  [facility] but there were no beds available.  His court case was dismissed today.  And he is not in a mental state to take care of himself upon release.  He is unaware of his surroundings and does not have a clear picture of where he is.  He is having conversations with people that are not there.  He does suffer from schizoaffective disorder with mood stability and hallucinations.  While in jail he has refused to take his medication at times."  Andrew Lindsey, 41 y.o., male patient seen face to face by this provider, consulted with Dr. Nelly Rout; and chart reviewed on 05/20/22.  On evaluation Andrew Lindsey reports he feels fine today.  He denies suicidal/homicidal ideation, psychosis, and paranoia.  He reports that he is sleeping fine but not eating because he can't pick what he wants to eat. He reports he has been taking his medications with no adverse reactions.  He again states that he did not follow up with [facility] and that he doesn't want to go to [facility] for outpatient psychic services "I don't want to go no where."  Patient states that he did not receive any medications while in jail.  He then talks about disliking Andrew Lindsey.  Stating that she was his doctor and that he didn't like her and didn't want to see her.   Prior to entering patients room he was yelling out loud.  During evaluation Andrew Lindsey is lying on his bed.  He calms down once provider is in the room.  He is alert, oriented x  4.  He is calm and cooperative throughout assessment.  His mood is euthymic with congruent affect.   He speech is garbled and appears to be pressured at time making it difficult to understand but appears to be his normal speech pattern.  Patient calms quickly once provider is in the room and he appears to be doing well.  He is able to give correct answer to current place, month, year, DOB, age, and president.    Objectively there is no evidence of psychosis/mania or delusional thinking.  Nursing reporting episodes of agitation and yelling out; not sure if patient acting out so that he can be medicated and if at this time patient is malingering.  He does report that he is currently homeless.  He is able to converse coherently, goal directed thoughts, no distractibility, or pre-occupation but then he will make random statement (when asking about follow up outpatient services he states that  he hates Andrew Lindsey and didn't want to see her "I hate Andrew Lindsey."  He denies suicidal/self-harm/homicidal ideation, psychosis, and paranoia.  Will continue to recommend inpatient psychiatric services at this time.  Patient possible at his baseline and may be better before accepted to hospital for psychiatric hospitalization.  Patient is also diagnosed with tourette's and the yelling out may be related to tourette's.     Principal Problem: Schizoaffective disorder, bipolar type (HCC) Diagnosis:  Principal Problem:  Schizoaffective disorder, bipolar type (HCC) Active Problems:   Agitation   ED Assessment Time Calculation: Start Time: 1000 Stop Time: 1030 Total Time in Minutes (Assessment Completion): 30   Past Psychiatric History: psychiatric history of Schizoaffective disorder bipolar type, obsessive compulsive disorder, and Tourette's  Grenada Scale:  Flowsheet Row ED from 05/16/2022 in [facility] EMERGENCY DEPARTMENT ED from 11/11/2021 in [facility] ED  from 11/06/2021 in [facility] EMERGENCY DEPARTMENT  C-SSRS RISK CATEGORY High Risk No Risk No Risk       Past Medical History:  Past Medical History:  Diagnosis Date   Bipolar 1 disorder (HCC)    Diabetes mellitus without complication (HCC)    Hypercholesteremia    Tourette's    History reviewed. No pertinent surgical history. Family History: History reviewed. No pertinent family history. Family Psychiatric  History: None reported Social History:  Social History   Substance and Sexual Activity  Alcohol Use Not Currently     Social History   Substance and Sexual Activity  Drug Use Never    Social History   Socioeconomic History   Marital status: Married    Spouse name: Not on file   Lindsey of children: Not on file   Years of education: Not on file   Highest education level: Not on file  Occupational History   Not on file  Tobacco Use   Smoking status: Never   Smokeless tobacco: Never  Vaping Use   Vaping Use: Never used  Substance and Sexual Activity   Alcohol use: Not Currently   Drug use: Never   Sexual activity: Not on file  Other Topics Concern   Not on file  Social History Narrative   Not on file   Social Determinants of Health   Financial Resource Strain: Not on file  Food Insecurity: Not on file  Transportation Needs: Not on file  Physical Activity: Not on file  Stress: Not on file  Social Connections: Not on file    Sleep: Good  Appetite:  Good  Current Medications: Current Facility-Administered Medications  Medication Dose Route Frequency Provider Last Rate Last Admin   acetaminophen (TYLENOL) tablet 650 mg  650 mg Oral Q4H PRN Andrew Dunker B, PA-C       ARIPiprazole (ABILIFY) tablet 20 mg  20 mg Oral Daily Andrew Betzold B, NP   20 mg at 05/20/22 1033   hydrOXYzine (ATARAX) tablet 25 mg  25 mg Oral TID PRN Jontavious Commons B, NP   25 mg at 05/20/22 0750   lithium carbonate (LITHOBID) CR tablet 300 mg  300 mg Oral Q12H Andrew Lindsey,  Eriona Kinchen B, NP   300 mg at 05/20/22 1033   LORazepam (ATIVAN) tablet 1 mg  1 mg Oral BID PRN Andrew Bridegroom, NP   1 mg at 05/17/22 1834   metFORMIN (GLUCOPHAGE) tablet 500 mg  500 mg Oral QPM Andrew Loveless, MD   500 mg at 05/19/22 2142   OLANZapine zydis (ZYPREXA) disintegrating tablet 10 mg  10 mg Oral BID PRN Andrew Bridegroom, NP   10 mg at 05/20/22 0039   ondansetron (ZOFRAN) tablet 4 mg  4 mg Oral Q8H PRN Andrew Dunker B, PA-C       propranolol (INDERAL) tablet 10 mg  10 mg Oral BID Andrew Loveless, MD   10 mg at 05/20/22 1033   simvastatin (ZOCOR) tablet 10 mg  10 mg Oral QPM Andrew Loveless, MD   10 mg at 05/19/22 2142  traZODone (DESYREL) tablet 100 mg  100 mg Oral QHS PRN Andrew Wilmer B, NP   100 mg at 05/19/22 2142   Current Outpatient Medications  Medication Sig Dispense Refill   glipiZIDE (GLUCOTROL) 5 MG tablet Take 5 mg by mouth every evening.     lithium carbonate (ESKALITH) 450 MG CR tablet Take 900 mg by mouth every evening.     metFORMIN (GLUCOPHAGE) 500 MG tablet Take 500 mg by mouth every evening.     propranolol (INDERAL) 10 MG tablet Take 10 mg by mouth 2 (two) times daily.     simvastatin (ZOCOR) 10 MG tablet Take 10 mg by mouth every evening.      Lab Results:  Results for orders placed or performed during the hospital encounter of 05/16/22 (from the past 48 hour(s))  Lithium level     Status: Abnormal   Collection Time: 05/19/22  9:58 AM  Result Value Ref Range   Lithium Lvl 0.07 (L) 0.60 - 1.20 mmol/L    Comment: Performed at [facility], [address], [place], [place] [address]    Blood Alcohol level:  Lab Results  Component Value Date   [facility] <10 05/16/2022   ETH <10 11/11/2021    Physical Findings:  CIWA:    COWS:     Musculoskeletal: Strength & Muscle Tone: within normal limits Gait & Station: normal Patient leans: N/A  Psychiatric Specialty Exam:  Presentation  General Appearance: Appropriate for Environment  Eye  Contact:Fair  Speech:Garbled  Speech Volume:Normal  Handedness:Right   Mood and Affect  Mood:Euthymic  Affect:Congruent   Thought Process  Thought Processes:Coherent; Linear  Descriptions of Associations:Circumstantial  Orientation:Full (Time, Place and Person)  Thought Content:Logical  History of Schizophrenia/Schizoaffective disorder:No (Pt said he has been dx w/schizophrenia; wife disputed)  Duration of Psychotic Symptoms:Less than six months  Hallucinations:Hallucinations: None Description of Auditory Hallucinations: Today he denies auditory hallucinations but nursing staff reports he has been talking to himself  Ideas of Reference:None  Suicidal Thoughts:Suicidal Thoughts: No  Homicidal Thoughts:Homicidal Thoughts: No   Sensorium  Memory:Immediate Fair; Recent Fair  Judgment:Fair  Insight:Lacking   Executive Functions  Concentration:Fair  Attention Span:Fair  Recall:Fair  Fund of Knowledge:Fair  Language:Fair   Psychomotor Activity  Psychomotor Activity:Psychomotor Activity: Normal   Assets  Assets:Physical Health; Desire for Improvement; Leisure Time   Sleep  Sleep:Sleep: Good    Physical Exam: Physical Exam Vitals and nursing note reviewed. Exam conducted with a chaperone present.  Constitutional:      General: He is not in acute distress.    Appearance: Normal appearance. He is not ill-appearing.  Cardiovascular:     Rate and Rhythm: Normal rate.  Pulmonary:     Effort: Pulmonary effort is normal.  Neurological:     Mental Status: He is alert and oriented to person, place, and time.  Psychiatric:        Attention and Perception: He perceives auditory hallucinations.        Mood and Affect: Mood normal.        Speech: Slurred: Garbled.  Possible normal speach pattern.        Behavior: Behavior is cooperative.        Thought Content: Thought content is not paranoid or delusional. Thought content does not include homicidal  or suicidal ideation.        Judgment: Judgment is impulsive.    Review of Systems  Constitutional: Negative.   HENT: Negative.    Eyes: Negative.  Respiratory: Negative.    Cardiovascular: Negative.   Gastrointestinal: Negative.   Genitourinary: Negative.   Musculoskeletal: Negative.   Skin: Negative.   Neurological: Negative.   Endo/Heme/Allergies: Negative.   Psychiatric/Behavioral:  Positive for hallucinations (Today he denies auditory hallucinations but nursing reporting that patient has been talking to himself). Depression: Stable. Suicidal ideas: Denies.The patient has insomnia. Nervous/anxious: stable.   Blood pressure 121/80, pulse 97, temperature 98.4 F (36.9 C), temperature source Oral, resp. rate 18, SpO2 100 %. There is no height or weight on file to calculate BMI.   Medical Decision Making: Reviewed:  EKG Interpretation  Date/Time:  Sunday May 19 2022 12:19:24 EDT Ventricular Rate:  88 PR Interval:  144 QRS Duration: 90 QT Interval:  368 QTC Calculation: 445 R Axis:   75 Text Interpretation: Normal sinus rhythm Normal ECG When compared with ECG of 11-Nov-2021 12:41, PREVIOUS ECG IS PRESENT Confirmed by Andrew Lindsey ([id number]) on 05/19/2022 4:22:21 PM   Medication management:  ARIPiprazole  20 mg Oral Daily   lithium carbonate  300 mg Oral Q12H   metFORMIN  500 mg Oral QPM   propranolol  10 mg Oral BID   simvastatin  10 mg Oral QPM    Reviewed labs:  Lithium level 0.07  Will continue with current treatment; no changes at this time.    Disposition: Recommend psychiatric Inpatient admission when medically cleared.   Latise Dilley, NP 05/20/2022, 3:51 PM

## 2022-05-20 NOTE — ED Notes (Signed)
Pt resting.

## 2022-05-20 NOTE — ED Notes (Signed)
PT becoming increasingly loud and agitated. RN attempted to redirect. PT refusing additional P.O. medication to help pt calm down. PT yelled at RN and was told, " Get the fuck away from me, I don't care if you are a girl I will fuck you up" MD Zammit notified.

## 2022-05-21 ENCOUNTER — Other Ambulatory Visit: Payer: Self-pay

## 2022-05-21 NOTE — ED Notes (Signed)
Psychiatry NP at bedside to evaluate patient

## 2022-05-21 NOTE — Progress Notes (Signed)
[facility] Psych ED Progress Note  05/21/2022 12:40 PM Andrew Lindsey  MRN:  [id number]   Subjective:   Patient seen today in his room at [facility] for face to face evaluation. Pt is laying in his bed, has minimal eye contact, and his responses are brief and usually one worded. He tells me they are not feeding him enough and he needs more snacks. He was reminded there are specific meal and snack times, patient was irritable after hearing this answer. He denies any suicidal or homicidal thoughts. Denies auditory or visual hallucinations. Per ED staff patient can still be noticed talking to himself in his room at times. ED RN attempted to give patient morning medications while I was in the room, pt originally tried to decline but after a lot of education he agreed to take PO medications. Pt was stating he does not take Abilify because it "makes him crazy." Reoriented pt that he has a history of taking Abilify and that is what he has been consistently receiving at hospital. He was oriented to name, date of birth, location, and month. He was not aware today is July 4th. Pt then requested I leave the room and turn the lights off.  Will continue to recommend inpatient psychiatric services at this time. Pt has shown improvement and could possibly be at baseline, however we are unsure at this time. Will continue current medication regimen.  Principal Problem: Schizoaffective disorder, bipolar type (HCC) Diagnosis:  Principal Problem:   Schizoaffective disorder, bipolar type (HCC) Active Problems:   Agitation   ED Assessment Time Calculation: Start Time: 1200 Stop Time: 1220 Total Time in Minutes (Assessment Completion): 20   Past Psychiatric History:  Hx of schizoaffective disorder bipolar type, OCD, and Tourette's  G[place] Scale:  Flowsheet Row ED from 05/16/2022 in [facility] EMERGENCY DEPARTMENT ED from 11/11/2021 in [facility] EMERGENCY DEPARTMENT ED from 11/06/2021 in  [facility] EMERGENCY DEPARTMENT  C-SSRS RISK CATEGORY High Risk No Risk No Risk       Past Medical History:  Past Medical History:  Diagnosis Date   Bipolar 1 disorder (HCC)    Diabetes mellitus without complication (HCC)    Hypercholesteremia    Tourette's    History reviewed. No pertinent surgical history. Family History: History reviewed. No pertinent family history.  Social History:  Social History   Substance and Sexual Activity  Alcohol Use Not Currently     Social History   Substance and Sexual Activity  Drug Use Never    Social History   Socioeconomic History   Marital status: Married    Spouse name: Not on file   Number of children: Not on file   Years of education: Not on file   Highest education level: Not on file  Occupational History   Not on file  Tobacco Use   Smoking status: Never   Smokeless tobacco: Never  Vaping Use   Vaping Use: Never used  Substance and Sexual Activity   Alcohol use: Not Currently   Drug use: Never   Sexual activity: Not on file  Other Topics Concern   Not on file  Social History Narrative   Not on file   Social Determinants of Health   Financial Resource Strain: Not on file  Food Insecurity: Not on file  Transportation Needs: Not on file  Physical Activity: Not on file  Stress: Not on file  Social Connections: Not on file    Sleep: Good  Appetite:  Good  Current Medications: Current Facility-Administered Medications  Medication Dose Route Frequency Provider Last Rate Last Admin   acetaminophen (TYLENOL) tablet 650 mg  650 mg Oral Q4H PRN Barrie Dunker B, PA-C       ARIPiprazole (ABILIFY) tablet 20 mg  20 mg Oral Daily Rankin, Shuvon B, NP   20 mg at 05/21/22 1001   hydrOXYzine (ATARAX) tablet 25 mg  25 mg Oral TID PRN Rankin, Shuvon B, NP   25 mg at 05/20/22 0750   lithium carbonate (LITHOBID) CR tablet 300 mg  300 mg Oral Q12H Rankin, Shuvon B, NP   300 mg at 05/21/22 1001   LORazepam (ATIVAN)  tablet 1 mg  1 mg Oral BID PRN Eligha Bridegroom, NP   1 mg at 05/20/22 2154   metFORMIN (GLUCOPHAGE) tablet 500 mg  500 mg Oral QPM Pricilla Loveless, MD   500 mg at 05/20/22 1754   OLANZapine zydis (ZYPREXA) disintegrating tablet 10 mg  10 mg Oral BID PRN Eligha Bridegroom, NP   10 mg at 05/20/22 2339   ondansetron (ZOFRAN) tablet 4 mg  4 mg Oral Q8H PRN Barrie Dunker B, PA-C       propranolol (INDERAL) tablet 10 mg  10 mg Oral BID Pricilla Loveless, MD   10 mg at 05/20/22 2154   simvastatin (ZOCOR) tablet 10 mg  10 mg Oral QPM Pricilla Loveless, MD   10 mg at 05/20/22 1754   traZODone (DESYREL) tablet 100 mg  100 mg Oral QHS PRN Rankin, Shuvon B, NP   100 mg at 05/20/22 2154   Current Outpatient Medications  Medication Sig Dispense Refill   glipiZIDE (GLUCOTROL) 5 MG tablet Take 5 mg by mouth every evening.     lithium carbonate (ESKALITH) 450 MG CR tablet Take 900 mg by mouth every evening.     metFORMIN (GLUCOPHAGE) 500 MG tablet Take 500 mg by mouth every evening.     propranolol (INDERAL) 10 MG tablet Take 10 mg by mouth 2 (two) times daily.     simvastatin (ZOCOR) 10 MG tablet Take 10 mg by mouth every evening.      Lab Results: No results found for this or any previous visit (from the past 48 hour(s)).  Blood Alcohol level:  Lab Results  Component Value Date   ETH <10 05/16/2022   ETH <10 11/11/2021    Psychiatric Specialty Exam:  Presentation  General Appearance: Fairly Groomed  Eye Contact:Minimal  Speech:Garbled  Speech Volume:Normal  Handedness:Right   Mood and Affect  Mood:Irritable  Affect:Flat   Thought Process  Thought Processes:Linear  Descriptions of Associations:Intact  Orientation:Full (Time, Place and Person)  Thought Content:Logical  History of Schizophrenia/Schizoaffective disorder:No (Pt said he has been dx w/schizophrenia; wife disputed)  Duration of Psychotic Symptoms:Less than six months  Hallucinations:Hallucinations: None Description  of Auditory Hallucinations: Today he denies auditory hallucinations but nursing staff reports he has been talking to himself  Ideas of Reference:None  Suicidal Thoughts:Suicidal Thoughts: No  Homicidal Thoughts:Homicidal Thoughts: No   Sensorium  Memory:Immediate Fair  Judgment:Poor  Insight:Poor   Executive Functions  Concentration:Fair  Attention Span:Fair  Recall:Fair  Fund of Knowledge:Fair  Language:Fair   Psychomotor Activity  Psychomotor Activity:Psychomotor Activity: Restlessness   Assets  Assets:Physical Health; Resilience; Talents/Skills   Sleep  Sleep:Sleep: Fair    Physical Exam: Physical Exam Neurological:     Mental Status: He is alert.  Psychiatric:        Behavior: Behavior is agitated. Behavior is cooperative.  Thought Content: Thought content normal.        Judgment: Judgment is impulsive.    Review of Systems  Psychiatric/Behavioral:  Negative for hallucinations and suicidal ideas.        Irritable  All other systems reviewed and are negative.  Blood pressure 110/65, pulse 80, temperature 98.1 F (36.7 C), temperature source Oral, resp. rate 18, SpO2 97 %. There is no height or weight on file to calculate BMI.   Medical Decision Making: Case reviewed and discussed with Dr. Lucianne Muss. At this time we will continue inpatient psychiatric admission search. EDP, LCSW, and RN have been notified.     Eligha Bridegroom, NP 05/21/2022, 12:40 PM

## 2022-05-21 NOTE — ED Provider Notes (Signed)
Emergency Medicine Observation Re-evaluation Note  Andrew Lindsey is a 41 y.o. male, seen on rounds today.  Pt initially presented to the ED for complaints of hx schizoaffective disorder, was in jail for ~ 3 months, due to overcrowding issues, patient was d/c from jail and promptly IVC'd by [facility] and sent to ED.  This AM, pt denies any new c/o, no physical c/o.   Physical Exam  BP 110/65 (BP Location: Right Arm)   Pulse 80   Temp 98.1 F (36.7 C) (Oral)   Resp 18   SpO2 97%  Physical Exam General: NAD Cardiac: Well perfused  Lungs: breathing comfortably Psych: no current agitation  ED Course / MDM    I have reviewed the labs performed to date as well as medications administered while in observation.  Recent changes in the last 24 hours: pt   Plan   Per psychiatry note 7/3, "Will continue to recommend inpatient psychiatric services at this time.  Patient possible at his baseline and may be better before accepted to hospital for psychiatric hospitalization.  Patient is also diagnosed with tourette's and the yelling out may be related to tourette's."  Pt is currently under IVC.       Ernie Avena, MD 05/21/22 [phone]

## 2022-05-21 NOTE — ED Notes (Signed)
Report given to Ashley, RN

## 2022-05-21 NOTE — Progress Notes (Signed)
Inpatient Behavioral Health Placement  Pt meets inpatient criteria per Eligha Bridegroom, NP. There are no available beds at [facility] per [facility] Rona Ravens, RN  Referral was sent to the following facilities;    Destination Service Provider Address Phone Fax Patient Preferred  [facility]  [address] [place], [place] [address] [phone] --  [facility]  [address] [place] [address] [phone] --  [facility]  [address], [place] [address] [phone] --  [facility]  [address], [place] [address] [phone] --  [facility]  [address] [place], [place] [address] [phone] --  [facility]  [address] [place], [place] [address] [phone] --  [facility]  [phone] [place] [address] [phone] --  [facility]  [address] [place] [address] [phone] --  [facility]  [address] [place] [address] [phone] --  [facility]  [address], [place] [address] [phone] --  [facility]  [address], [place] [address] [phone] --  [facility]  [address] [place] [address] [phone] --  [facility] [place] [address] [phone] --  [facility]  [address], [place] [address] [phone] --  [facility]  [address] [place] [address] [phone] --  [facility]  [address] [place] [address] [phone] --   [facility]  [address] [place] [address] [phone] --  [facility]  [address] [place] [address] [phone] --  [facility]  [address] [place] [address] [phone] --   Situation ongoing,  CSW will follow up.   Maryjean Ka, MSW, LCSWA 05/21/2022  @ 1:40 PM

## 2022-05-22 LAB — LITHIUM LEVEL: Lithium Lvl: 0.43 mmol/L — ABNORMAL LOW (ref 0.60–1.20)

## 2022-05-22 MED ORDER — LITHIUM CARBONATE ER 450 MG PO TBCR
450.0000 mg | EXTENDED_RELEASE_TABLET | Freq: Two times a day (BID) | ORAL | Status: DC
  Administered 2022-05-23 – 2022-05-27 (×10): 450 mg via ORAL
  Filled 2022-05-22 (×10): qty 1

## 2022-05-22 MED ORDER — ZIPRASIDONE MESYLATE 20 MG IM SOLR
20.0000 mg | Freq: Once | INTRAMUSCULAR | Status: AC
  Administered 2022-05-22: 20 mg via INTRAMUSCULAR
  Filled 2022-05-22: qty 20

## 2022-05-22 MED ORDER — DIPHENHYDRAMINE HCL 50 MG/ML IJ SOLN
25.0000 mg | Freq: Once | INTRAMUSCULAR | Status: AC
  Administered 2022-05-22: 25 mg via INTRAMUSCULAR
  Filled 2022-05-22: qty 1

## 2022-05-22 NOTE — Progress Notes (Signed)
[facility] Psych ED Progress Note  05/22/2022 12:58 PM Andrew Lindsey  MRN:  [id number]   Subjective:   Patient seen today in his room at [facility] for face to face follow up. ED staff informs he continues to RTIS in his room, usually looking up towards the ceiling and talking. I asked the patient who he is talking to and he says "nobody." When I ask if he is experiencing auditory hallucinations he stops to think and then states "maybe sometimes." Pt would not elaborate any further on nature of AH. He denies VH although staff states he will look around his room at times. He denies any suicidal or homicidal thoughts. Pt has been medication compliant for 48 hours now. There does appear to be some improvements, patient continues to be irritable which could be his baseline, but his agitation has significantly decreased. I ask the patient if he has been getting along with staff and he stated "I am nasty to them. Sometimes I curse and yell at them." When I mentioned that he needs to be respectful to staff to help transfer him to an IP facility he stated "as long as it isn't jail that's fine." Pt continues to have some garbled speech, but this could also be his baseline.   Principal Problem: Schizoaffective disorder, bipolar type (HCC) Diagnosis:  Principal Problem:   Schizoaffective disorder, bipolar type (HCC) Active Problems:   Agitation   ED Assessment Time Calculation: Start Time: 1000 Stop Time: 1020 Total Time in Minutes (Assessment Completion): 20   Past Psychiatric History:  Previous hx of incarceration, schizoaffective dx, bipolar type, OCD, and Tourette's.   [place] Scale:  Flowsheet Row ED from 05/16/2022 in [facility] EMERGENCY DEPARTMENT ED from 11/11/2021 in [facility] EM[facility]GENC[facility]PART[facility] ED from 11/06/2021 in [facility] EMERGENCY DEPARTMENT  C-SSRS RISK CATEGORY High Risk No Risk No Risk       Past Medical History:  Past Medical History:   Diagnosis Date   Bipolar 1 disorder (HCC)    Diabetes mellitus without complication (HCC)    Hypercholesteremia    Tourette's    History reviewed. No pertinent surgical history. Family History: History reviewed. No pertinent family history.  Social History:  Social History   Substance and Sexual Activity  Alcohol Use Not Currently     Social History   Substance and Sexual Activity  Drug Use Never    Social History   Socioeconomic History   Marital status: Married    Spouse name: Not on file   Number of children: Not on file   Years of education: Not on file   Highest education level: Not on file  Occupational History   Not on file  Tobacco Use   Smoking status: Never   Smokeless tobacco: Never  Vaping Use   Vaping Use: Never used  Substance and Sexual Activity   Alcohol use: Not Currently   Drug use: Never   Sexual activity: Not on file  Other Topics Concern   Not on file  Social History Narrative   Not on file   Social Determinants of Health   Financial Resource Strain: Not on file  Food Insecurity: Not on file  Transportation Needs: Not on file  Physical Activity: Not on file  Stress: Not on file  Social Connections: Not on file    Sleep: Fair  Appetite:  Good  Current Medications: Current Facility-Administered Medications  Medication Dose Route Frequency Provider Last Rate Last Admin   acetaminophen (  TYLENOL) tablet 650 mg  650 mg Oral Q4H PRN Darrick Grinder, PA-C       ARIPiprazole (ABILIFY) tablet 20 mg  20 mg Oral Daily Rankin, Shuvon B, NP   20 mg at 05/22/22 1042   hydrOXYzine (ATARAX) tablet 25 mg  25 mg Oral TID PRN Rankin, Shuvon B, NP   25 mg at 05/22/22 1042   lithium carbonate (LITHOBID) CR tablet 300 mg  300 mg Oral Q12H Rankin, Shuvon B, NP   300 mg at 05/22/22 1042   LORazepam (ATIVAN) tablet 1 mg  1 mg Oral BID PRN Eligha Bridegroom, NP   1 mg at 05/22/22 0155   metFORMIN (GLUCOPHAGE) tablet 500 mg  500 mg Oral QPM Pricilla Loveless, MD   500 mg at 05/21/22 1810   OLANZapine zydis (ZYPREXA) disintegrating tablet 10 mg  10 mg Oral BID PRN Eligha Bridegroom, NP   10 mg at 05/21/22 2056   ondansetron (ZOFRAN) tablet 4 mg  4 mg Oral Q8H PRN Barrie Dunker B, PA-C       propranolol (INDERAL) tablet 10 mg  10 mg Oral BID Pricilla Loveless, MD   10 mg at 05/21/22 2153   simvastatin (ZOCOR) tablet 10 mg  10 mg Oral QPM Pricilla Loveless, MD   10 mg at 05/21/22 1810   traZODone (DESYREL) tablet 100 mg  100 mg Oral QHS PRN Rankin, Shuvon B, NP   100 mg at 05/20/22 2154   Current Outpatient Medications  Medication Sig Dispense Refill   glipiZIDE (GLUCOTROL) 5 MG tablet Take 5 mg by mouth every evening.     lithium carbonate (ESKALITH) 450 MG CR tablet Take 900 mg by mouth every evening.     metFORMIN (GLUCOPHAGE) 500 MG tablet Take 500 mg by mouth every evening.     propranolol (INDERAL) 10 MG tablet Take 10 mg by mouth 2 (two) times daily.     simvastatin (ZOCOR) 10 MG tablet Take 10 mg by mouth every evening.      Lab Results: No results found for this or any previous visit (from the past 48 hour(s)).  Blood Alcohol level:  Lab Results  Component Value Date   [facility] <10 05/16/2022   ETH <10 11/11/2021    Psychiatric Specialty Exam:  Presentation  General Appearance: Appropriate for Environment  Eye Contact:Fair  Speech:Garbled  Speech Volume:Normal  Handedness:Right   Mood and Affect  Mood:Irritable  Affect:Congruent   Thought Process  Thought Processes:Linear  Descriptions of Associations:Intact  Orientation:Full (Time, Place and Person)  Thought Content:Logical  History of Schizophrenia/Schizoaffective disorder:No (Pt said he has been dx w/schizophrenia; wife disputed)  Duration of Psychotic Symptoms:Less than six months  Hallucinations:Hallucinations: Auditory  Ideas of Reference:None  Suicidal Thoughts:Suicidal Thoughts: No  Homicidal Thoughts:Homicidal Thoughts: No   Sensorium   Memory:Immediate Fair  Judgment:Impaired  Insight:Fair   Executive Functions  Concentration:Fair  Attention Span:Fair  Recall:Fair  Fund of Knowledge:Fair  Language:Fair   Psychomotor Activity  Psychomotor Activity:Psychomotor Activity: Normal   Assets  Assets:Physical Health; Resilience   Sleep  Sleep:Sleep: Fair    Physical Exam: Physical Exam Neurological:     Mental Status: He is alert.  Psychiatric:        Attention and Perception: He perceives auditory hallucinations.        Behavior: Behavior is cooperative.        Thought Content: Thought content normal.        Judgment: Judgment is impulsive.    Review of Systems  Psychiatric/Behavioral:  Positive for hallucinations.        Auditory hallucinations, irritable  All other systems reviewed and are negative.  Blood pressure 114/69, pulse 78, temperature 98.6 F (37 C), temperature source Oral, resp. rate 20, SpO2 99 %. There is no height or weight on file to calculate BMI.   Medical Decision Making: - Will recheck Lithium level tomorrow morning prior to medication administration before increasing dose.  - Continue to recommend inpatient psychiatric hospitalization. LCSW, RN, and EDP updated on disposition.     Vesta Mixer, NP 05/22/2022, 12:58 PM

## 2022-05-22 NOTE — Progress Notes (Signed)
Pt has been denied by [facility]. CSW will continue to assist with placement.   Maryjean Ka, MSW, [facility] 05/22/2022 10:34 PM

## 2022-05-22 NOTE — ED Notes (Signed)
Pt responding to internal stimuli talking in a loud voice . Pt keeps repeating he was going to get a shot gun and shoot of his head.

## 2022-05-22 NOTE — ED Notes (Signed)
Patient allowed RN to administer meds IM without issues; Security and male tech at bedside and has hand placed lightly on patients arm for security of RN as med is administered; pt is calm at this time-Monique,RN

## 2022-05-22 NOTE — ED Notes (Signed)
ED Breakfast order placed 

## 2022-05-22 NOTE — ED Notes (Signed)
Spoke with representative from [employer] regarding patient's behaviors; after review of patient [employer] has declined patient; Staci Acosta, LCSW secured chatted information-Monique,RN

## 2022-05-22 NOTE — Progress Notes (Signed)
Inpatient Behavioral Health  Pt meets inpatient criteria per Eligha Bridegroom, NP. There are no available beds at [facility] per [facility] Fransico Michael, RN.  Referral was sent to the following facilities;    Destination Service Provider Address Phone Fax  [facility]  [address] [place], [place] [address] [phone]  [facility]  [address] [place] [address] [phone]  [facility]  [address] [place] [address] [phone]  [facility]  [address] [place] [address] [phone]  [facility]  [address] [place], [place] [address] [phone]  [facility]  [address] [place], [place] [address] [phone]  [facility]  [address] [place] [address] [phone]  [facility]  [address] [place] [address] [phone]  [facility]  [address] [place] [address] [phone]  [facility]  [address], [place] [address] [phone]  [facility]  [address] [place] [address] [phone]  [facility]  [address] [place] [address] [phone]  [facility] [address] [place] [address] [phone]  [facility]  [address], [place] [address] [phone]  [facility]  [address] [place] [address] [phone]  [facility]  [address] [place] [address] [phone]  [facility]  [address] [place] [address] [phone]  [facility]  [address] [place] [address] [phone]  [facility]  [address], [place] [address] [phone]     Situation ongoing,  CSW will follow up.   Maryjean Ka, MSW, LCSWA 05/22/2022  @ 10:14 PM
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
## 2022-05-22 NOTE — Progress Notes (Signed)
Per [facility] Brook McNichol, RN pt is under review. Pt meets inpatient criteria per Eligha Bridegroom, NP. Oncoming [facility] Joslyn Devon, RN to follow up with CSW and care team.  Maryjean Ka, MSW, [facility] 05/22/2022 2:06 PM

## 2022-05-22 NOTE — ED Notes (Signed)
Pt responding to internal stimuli talking out loud to himself. Pt talking about child molesters and shot guns.

## 2022-05-22 NOTE — ED Notes (Signed)
Patient is agitates and cursing (shouting) and he appears to be hallucinating (talking to a wall). Ativan will be given.

## 2022-05-22 NOTE — ED Provider Notes (Signed)
Patient is once again increasingly aggressive, additional Geodon and Benadryl needing to be ordered.   Terald Sleeper, MD 05/22/22 [phone]

## 2022-05-22 NOTE — ED Provider Notes (Signed)
Emergency Medicine Observation Re-evaluation Note  Andrew Lindsey is a 41 y.o. male, seen on rounds today.  Pt initially presented to the ED for complaints of hx schizoaffective disorder, was in jail for ~ 3 months, due to overcrowding issues, patient was d/c from jail and promptly IVC'd by [facility] and sent to ED.  This AM, pt denies any new c/o, no physical c/o.   Physical Exam  BP 120/79   Pulse 99   Temp 98.3 F (36.8 C) (Oral)   Resp 18   SpO2 100%  Physical Exam General: No distress Cardiac: Well perfused  Lungs: respirations even and unlabored Psych: no current agitation  ED Course / MDM    I have reviewed the labs performed to date as well as medications administered while in observation.  Recent changes in the last 24 hours: No available [facility] beds, episode of agitation overnight, responded well to ativan.  Plan  Inpatient admission, social work coordinating.  Pt is currently under IVC.         Ernie Avena, MD 05/22/22 0830

## 2022-05-23 MED ORDER — HALOPERIDOL 5 MG PO TABS
5.0000 mg | ORAL_TABLET | Freq: Two times a day (BID) | ORAL | Status: DC | PRN
  Administered 2022-05-23: 5 mg via ORAL
  Filled 2022-05-23: qty 1

## 2022-05-23 MED ORDER — HALOPERIDOL LACTATE 5 MG/ML IJ SOLN
10.0000 mg | Freq: Two times a day (BID) | INTRAMUSCULAR | Status: DC | PRN
Start: 2022-05-23 — End: 2022-05-24
  Administered 2022-05-24 (×2): 10 mg via INTRAMUSCULAR
  Filled 2022-05-23 (×2): qty 2

## 2022-05-23 MED ORDER — BENZTROPINE MESYLATE 1 MG PO TABS: 1.0000 mg | ORAL_TABLET | Freq: Two times a day (BID) | ORAL | Status: DC | PRN

## 2022-05-23 MED ORDER — BENZTROPINE MESYLATE 1 MG PO TABS
1.0000 mg | ORAL_TABLET | Freq: Two times a day (BID) | ORAL | Status: DC | PRN
Start: 2022-05-23 — End: 2022-05-23

## 2022-05-23 MED ORDER — HALOPERIDOL LACTATE 5 MG/ML IJ SOLN
5.0000 mg | Freq: Once | INTRAMUSCULAR | Status: AC
  Administered 2022-05-23: 5 mg via INTRAMUSCULAR
  Filled 2022-05-23: qty 1

## 2022-05-23 MED ORDER — BENZTROPINE MESYLATE 1 MG/ML IJ SOLN
1.0000 mg | Freq: Two times a day (BID) | INTRAMUSCULAR | Status: DC | PRN
  Administered 2022-05-24 (×2): 1 mg via INTRAMUSCULAR
  Filled 2022-05-23 (×2): qty 2

## 2022-05-23 MED ORDER — LORAZEPAM 2 MG/ML IJ SOLN
2.0000 mg | Freq: Once | INTRAMUSCULAR | Status: AC
Start: 2022-05-23 — End: 2022-05-23
  Administered 2022-05-23: 2 mg via INTRAMUSCULAR
  Filled 2022-05-23: qty 1

## 2022-05-23 NOTE — ED Provider Notes (Signed)
Patient is agitated, yelling, throwing things and damaging hospital property. Has not had good results with Geodon. Will try Haldol/Ativan.    Pollyann Savoy, MD 05/23/22 [phone]

## 2022-05-23 NOTE — Consult Note (Signed)
Patient sleeping this morning. He had multiple episodes of agitation and aggression towards staff and security last night. Pt was given Benadryl 25mg  IM and Geodon 20mg  IM at 1945. Pt had little relief from these medications, and continued to be agitated, yelling, throwing items, and disturbing the unit. Pt given Haldol 5mg  IM and Ativan 2mg  IM at 0345. Pt has been sleeping since Haldol/Ativan combination.   There are still concerns of secondary gain to his agitation. Pt seems to have a pattern of becoming agitated and yelling that seems unprovoked, but then always cooperates with IM medications. Ed staff has been notified to try and utilize PO medications instead of IM if indicated.   Psychiatry will continue to follow up. Pt lithium level resulted at .43 mmol/L, which is still below therapeutic range. Lithium increased to 450mg  BID. ED staff continues to document pt responding to internal stimuli through out day, and his baseline is still at question. At this time we will continue to recommend IP treatment for further assessment and stabilization. EDP, RN, and LCSW notified. If no beds at [facility] patient will be faxed out.   Pt case reviewed and discussed with Dr. .

## 2022-05-23 NOTE — ED Notes (Signed)
Patient at desk making phone call-Monique,RN  

## 2022-05-23 NOTE — ED Notes (Addendum)
Patient states he is requesting a medication for Tourette's syndrome which causes him to do uncontrolled cursing at staff when he does not mean it; Patient states he was on a medication a long time ago for Tourette's but could not remember what the med was called; RN advised that chart will be noted for request-Monique,RN

## 2022-05-23 NOTE — ED Notes (Signed)
ED Provider at bedside. 

## 2022-05-23 NOTE — ED Notes (Signed)
IVC expires today. Delivered IVC file to Dr. Lockie Mola who will deliver renewal documents to me when he has completed them.

## 2022-05-23 NOTE — ED Notes (Signed)
Faxed renewal to Magistrate; made follow-up call to Magistrate regarding receipt of renewal.

## 2022-05-23 NOTE — ED Notes (Signed)
Pt is currently sleeping. Pt is aggressive and will get VS when pt wakes up. Chest rise and fall noted. RR- 15.

## 2022-05-23 NOTE — Progress Notes (Signed)
CSW started the process for the [facility] referral. This CSW will follow up in the AM with [facility].  Maryjean Ka, MSW, LCSWA 05/23/2022 4:03 PM

## 2022-05-23 NOTE — ED Notes (Signed)
IVC renewal complete; copies made for MR w/labels, 3 sets in IVC binder given to [facility] RN; faxed copy to [facility]. New exp: 05/30/2022

## 2022-05-23 NOTE — ED Provider Notes (Signed)
Emergency Medicine Observation Re-evaluation Note  Andrew Lindsey is a 41 y.o. male, seen on rounds today.  Pt initially presented to the ED for complaints of IVC Currently, the patient is resting, needed sedation overnight.  Physical Exam  BP (!) 142/88 (BP Location: Right Arm)   Pulse (!) 103   Temp 98.6 F (37 C) (Oral)   Resp 20   SpO2 97%  Physical Exam Skin:    Capillary Refill: Capillary refill takes less than 2 seconds.  Neurological:     General: No focal deficit present.     Mental Status: He is alert.      ED Course / MDM  EKG:EKG Interpretation  Date/Time:  Sunday May 19 2022 12:19:24 EDT Ventricular Rate:  88 PR Interval:  144 QRS Duration: 90 QT Interval:  368 QTC Calculation: 445 R Axis:   75 Text Interpretation: Normal sinus rhythm Normal ECG When compared with ECG of 11-Nov-2021 12:41, PREVIOUS ECG IS PRESENT Confirmed by [name] ([id number]) on 05/19/2022 4:22:21 PM  I have reviewed the labs performed to date as well as medications administered while in observation.  Recent changes in the last 24 hours include reuired sedation overnight.  Plan  Current plan is for inpatient psych. Andrew Lindsey is under involuntary commitment.      Virgina Norfolk, DO 05/23/22 [phone]

## 2022-05-23 NOTE — Progress Notes (Signed)
CSW sent updated labs, and provider notes to [facility]. CSW will assist and follow.  Maryjean Ka, MSW, LCSWA 05/23/2022 2:14 PM

## 2022-05-23 NOTE — ED Notes (Signed)
Patient continues to yell and scream while talking to self and at times addressing staff; pt threw pillow out his door and came to the door sticking his middle finger up at [employer] saying "F you!" Patient is disrupting other parts of ED with yelling; Charge RN aware of patient's behavior and request EDP to assess for new med orders-Monique,RN

## 2022-05-24 MED ORDER — HALOPERIDOL LACTATE 5 MG/ML IJ SOLN
10.0000 mg | Freq: Three times a day (TID) | INTRAMUSCULAR | Status: AC | PRN
  Administered 2022-05-24 – 2022-05-26 (×4): 10 mg via INTRAMUSCULAR
  Filled 2022-05-24 (×4): qty 2

## 2022-05-24 MED ORDER — DIPHENHYDRAMINE HCL 50 MG/ML IJ SOLN
50.0000 mg | Freq: Three times a day (TID) | INTRAMUSCULAR | Status: DC | PRN
  Administered 2022-05-24 – 2022-05-26 (×4): 50 mg via INTRAMUSCULAR
  Filled 2022-05-24 (×5): qty 1

## 2022-05-24 MED ORDER — LORAZEPAM 2 MG/ML IJ SOLN
2.0000 mg | Freq: Once | INTRAMUSCULAR | Status: DC
  Filled 2022-05-24: qty 1

## 2022-05-24 MED ORDER — LORAZEPAM 2 MG/ML IJ SOLN
1.0000 mg | Freq: Three times a day (TID) | INTRAMUSCULAR | Status: AC | PRN
  Administered 2022-05-24 – 2022-05-27 (×4): 1 mg via INTRAMUSCULAR
  Filled 2022-05-24 (×3): qty 1

## 2022-05-24 NOTE — ED Provider Notes (Signed)
Emergency Medicine Observation Re-evaluation Note  Kota Ciancio is a 41 y.o. male, seen on rounds today.  Pt initially presented to the ED for complaints of IVC Currently, the patient is resting.  Physical Exam  BP 104/66 (BP Location: Right Arm)   Pulse 87   Temp 98 F (36.7 C) (Oral)   Resp 18   SpO2 96%  Physical Exam General: Resting Cardiac: No murmur on my auscultation this morning Lungs: Clear Psych: No agitation after medications  ED Course / MDM  EKG:EKG Interpretation  Date/Time:  Sunday May 19 2022 12:19:24 EDT Ventricular Rate:  88 PR Interval:  144 QRS Duration: 90 QT Interval:  368 QTC Calculation: 445 R Axis:   75 Text Interpretation: Normal sinus rhythm Normal ECG When compared with ECG of 11-Nov-2021 12:41, PREVIOUS ECG IS PRESENT Confirmed by [name] ([id number]) on 05/19/2022 4:22:21 PM  I have reviewed the labs performed to date as well as medications administered while in observation.  Recent changes in the last 24 hours include nursing report he threw food and had to get sedated with medications.  Plan  Current plan is for awaiting psychiatric management recommendations. Maylon Sailors is under involuntary commitment.      Kawanna Christley, Canary Brim, MD 05/24/22 [phone]

## 2022-05-24 NOTE — ED Notes (Addendum)
Patient in room yelling and cursing. Patient refused vitals.

## 2022-05-24 NOTE — ED Notes (Signed)
Patient yelling obscenities and verbally aggressive toward staff.

## 2022-05-24 NOTE — Progress Notes (Signed)
CSW completed demographics with [name] [phone] intake at [facility]. [name] advised that the referral verbal has been completed and now move onto review. 2nd shift to follow up to see if pt has been moved to the [facility] waitlist.    Maryjean Ka, MSW, [facility] 05/24/2022 2:09 PM

## 2022-05-24 NOTE — ED Notes (Signed)
Patient jumped up and all of a sudden walked to patient's door of Andrew Lindsey and started staring when staff redirected pt started yelling and mumbling something about the other patient; Pt then turned over a chair in the unit and slammed the door; Security in unit for safety; RN will continue to monitor for escalation and consult EDP if need for more or different agitation meds as all PRN has been exhausted at the [facility]
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
## 2022-05-24 NOTE — ED Notes (Signed)
Pt noted to be verbally escalating and started throwing items off his breakfast tray.  Pt yelling and cussing at "Andrew Lindsey" and initally threw grits.  When this Clinical research associate entered doorway to address actions, Pt began to verbally escalate further and threw entire breakfast tray off the bed.  This writer attempted to verbally deescalate Pt w/o success.   Security at bedside.

## 2022-05-24 NOTE — ED Notes (Signed)
Patient on the phone irate.

## 2022-05-24 NOTE — ED Notes (Signed)
Pt took medications w/o incident.    Pt continues to ask for more food.  Sandwich bag provided.  Pt ate food w/o incident.

## 2022-05-24 NOTE — ED Notes (Signed)
Pt noted to fall asleep for a brief time.

## 2022-05-24 NOTE — ED Notes (Addendum)
Patient in room cursing.  He refused to take PRN Ativan stating, "I don't want it.  Move or I'll hit you."

## 2022-05-24 NOTE — ED Notes (Addendum)
Patient in room yelling and cursing.  Unable to be redirected..  MD notified.

## 2022-05-24 NOTE — Progress Notes (Signed)
This CSW received a phone call from Vivien Rossetti with [facility] advising that pt has now been moved to the waitlist. No follow up at this time. CSW will continue to assist.  Maryjean Ka, MSW, [facility] 05/24/2022 2:36 PM

## 2022-05-24 NOTE — ED Notes (Signed)
Patient has increased in agitation and yelling out loud and disrupting other units; RN administered PRN med; Security  at bedside for safety but did not have to hold patient-Monique,RN

## 2022-05-25 LAB — CBG MONITORING, ED
Glucose-Capillary: 235 mg/dL — ABNORMAL HIGH (ref 70–99)
Glucose-Capillary: 203 mg/dL — ABNORMAL HIGH (ref 70–99)

## 2022-05-25 NOTE — ED Notes (Signed)
Patient was given a sandwich and ginger ale for snack

## 2022-05-25 NOTE — ED Provider Notes (Signed)
Emergency Medicine Observation Re-evaluation Note  Andrew Lindsey is a 41 y.o. male, seen on rounds today.  Pt initially presented to the ED for complaints of IVC Currently, the patient is resting.  Physical Exam  BP 109/67 (BP Location: Right Arm)   Pulse 90   Temp 98.2 F (36.8 C) (Oral)   Resp 18   SpO2 97%  Physical Exam General: NAD Cardiac:Well perfused Lungs: Even and unlabored Psych: No agitation  ED Course / MDM  EKG:EKG Interpretation  Date/Time:  Sunday May 19 2022 12:19:24 EDT Ventricular Rate:  88 PR Interval:  144 QRS Duration: 90 QT Interval:  368 QTC Calculation: 445 R Axis:   75 Text Interpretation: Normal sinus rhythm Normal ECG When compared with ECG of 11-Nov-2021 12:41, PREVIOUS ECG IS PRESENT Confirmed by [name] ([id number]) on 05/19/2022 4:22:21 PM  I have reviewed the labs performed to date as well as medications administered while in observation.  Recent changes in the last 24 hours include nursing report pt received PRN Ativan for mild hyper-aroused behavior.  Plan  Current plan is for awaiting full psychiatric management recommendations. Andrew Lindsey is under involuntary commitment.      Tegeler, Canary Brim, MD 05/24/22 1749    Ernie Avena, MD 05/25/22 [phone]

## 2022-05-25 NOTE — ED Notes (Signed)
Pt behavior escalated after this RN informed him he had his 2 phone calls today and he would have to wait until tomorrow. Pt went back to room shouting loudly responding to internal stimuli.

## 2022-05-25 NOTE — ED Notes (Signed)
Patient yelling out in room after making phone call to family member  Saying they don't give a Fuck they have millions of dollars but wont help him

## 2022-05-25 NOTE — ED Notes (Signed)
Pt heard responding to internal stimuli from his room w/ a loud voice telling someone to "go to hell" repeatively.

## 2022-05-25 NOTE — ED Notes (Signed)
Pt ambulated back into room and remains calm and cooperative. Lights turned on in room per pt request.

## 2022-05-25 NOTE — ED Notes (Signed)
Pt continues w/ loud, very rapid, pressured speech w/ flight of ideas and disorganized speech.

## 2022-05-25 NOTE — ED Notes (Signed)
Pt still elevated w/ verbal aggression towards internal stimuli and rapid pressured speech.

## 2022-05-25 NOTE — ED Notes (Signed)
Pt w/ loud speech responding to internal stimuli in his room.

## 2022-05-25 NOTE — ED Notes (Signed)
Pt sitting calmly in chair beside nurses' station

## 2022-05-25 NOTE — ED Notes (Signed)
Patient calm and cooperative this morning.  Requesting to use phone.  Made aware that phone privileges start after 9 in the morning.  Understanding voiced

## 2022-05-25 NOTE — ED Notes (Signed)
Pt loudly singing [place] and saying [facility].

## 2022-05-25 NOTE — ED Notes (Signed)
Pt resting calm and cooperatively; rise and fall of chest noted

## 2022-05-25 NOTE — ED Notes (Signed)
Patient continues to rest quietly on bed in room.  Will continue to monitor.  Medications and vitals held at this time due to patient's hx of aggression towards staff

## 2022-05-26 MED ORDER — HALOPERIDOL 1 MG PO TABS
2.0000 mg | ORAL_TABLET | Freq: Every day | ORAL | Status: DC
  Administered 2022-05-27: 2 mg via ORAL
  Filled 2022-05-26: qty 2

## 2022-05-26 NOTE — ED Notes (Signed)
Pt sitting on side of bed eating lunch. Pt much more calm, still talking with internal stimuli but is much quieter and voices feeling better after medication

## 2022-05-26 NOTE — ED Notes (Signed)
Pt currently lying in bed talking with internal stimuli

## 2022-05-26 NOTE — ED Provider Notes (Addendum)
Emergency Medicine Observation Re-evaluation Note  Temitayo Covalt is a 41 y.o. male, seen on rounds today.  Pt initially presented to the ED for complaints of IVC Currently, the patient is resting.  Physical Exam  BP 112/69 (BP Location: Right Arm)   Pulse 92   Temp 97.7 F (36.5 C) (Oral)   Resp 18   SpO2 97%  Physical Exam General: NAD Cardiac:Well perfused Lungs: Even and unlabored Psych: No agitation  ED Course / MDM  EKG:EKG Interpretation  Date/Time:  Sunday May 19 2022 12:19:24 EDT Ventricular Rate:  88 PR Interval:  144 QRS Duration: 90 QT Interval:  368 QTC Calculation: 445 R Axis:   75 Text Interpretation: Normal sinus rhythm Normal ECG When compared with ECG of 11-Nov-2021 12:41, PREVIOUS ECG IS PRESENT Confirmed by [name] ([id number]) on 05/19/2022 4:22:21 PM  I have reviewed the labs performed to date as well as medications administered while in observation.  Recent changes in the last 24 hours include nursing report pt has had episodes of agitation and aggression, requiring PRNs. Psychiatry recommendations pending.  Plan  Current plan is for awaiting full psychiatric management recommendations. Andriel Omalley is under involuntary commitment.      Tegeler, Canary Brim, MD 05/24/22 2620    Ernie Avena, MD 05/25/22 3559    Ernie Avena, MD 05/26/22 1528    Ernie Avena, MD 05/26/22 1530

## 2022-05-26 NOTE — ED Notes (Signed)
Patient being talking to himself and then came out of the room and sat in a chair close to exit; a few  minutes passed and patient walked out of exit and could not be redirected back to unit; Security called and sitter is behind [employer]

## 2022-05-26 NOTE — ED Notes (Signed)
Pt was sitting outside room eating breakfast, finished eating breakfast and ambulated back into room

## 2022-05-26 NOTE — ED Notes (Signed)
Pt lying in bed resting quietly, not currently talking to self or others and breathing evenly

## 2022-05-26 NOTE — ED Notes (Signed)
While administering meds patient asked RN "Can you shot me with a needle with HIV so I can just die!? I wanna die!"-Monique,RN

## 2022-05-26 NOTE — ED Notes (Signed)
Pt's behavior escalating, pt screaming and cursing, verbally threatening staff, and continually slamming door to room. Pt restless and upset. Security called to room. Pt's behavior still escalated, medication pulled to administer. Multiple security guards in room while this nurse gave injections to L deltoid. Put currently sitting in bed, still talking to internal stimuli and having occasional verbal outbursts but threatening behavior resolved at this time
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
## 2022-05-27 LAB — COMPREHENSIVE METABOLIC PANEL
ALT: 18 U/L (ref 0–44)
AST: 23 U/L (ref 15–41)
Albumin: 3.7 g/dL (ref 3.5–5.0)
Alkaline Phosphatase: 74 U/L (ref 38–126)
Anion gap: 9 (ref 5–15)
BUN: 19 mg/dL (ref 6–20)
CO2: 26 mmol/L (ref 22–32)
Calcium: 9.1 mg/dL (ref 8.9–10.3)
Chloride: 101 mmol/L (ref 98–111)
Creatinine, Ser: 1.05 mg/dL (ref 0.61–1.24)
GFR, Estimated: >60 mL/min (ref >60)
Glucose, Bld: 214 mg/dL — ABNORMAL HIGH (ref 70–99)
Potassium: 4.2 mmol/L (ref 3.5–5.1)
Sodium: 136 mmol/L (ref 135–145)
Total Bilirubin: 0.7 mg/dL (ref 0.3–1.2)
Total Protein: 6.3 g/dL — ABNORMAL LOW (ref 6.5–8.1)

## 2022-05-27 LAB — CBC WITH DIFFERENTIAL/PLATELET
Abs Immature Granulocytes: 0.1 10*3/uL — ABNORMAL HIGH (ref 0.00–0.07)
Basophils Absolute: 0.1 10*3/uL (ref 0.0–0.1)
Basophils Relative: 1 %
Eosinophils Absolute: 0.3 10*3/uL (ref 0.0–0.5)
Eosinophils Relative: 3 %
HCT: 39.6 % (ref 39.0–52.0)
Hemoglobin: 13.3 g/dL (ref 13.0–17.0)
Immature Granulocytes: 1 %
Lymphocytes Relative: 18 %
Lymphs Abs: 2.3 10*3/uL (ref 0.7–4.0)
MCH: 28.9 pg (ref 26.0–34.0)
MCHC: 33.6 g/dL (ref 30.0–36.0)
MCV: 86.1 fL (ref 80.0–100.0)
Monocytes Absolute: 0.5 10*3/uL (ref 0.1–1.0)
Monocytes Relative: 4 %
Neutro Abs: 9.4 10*3/uL — ABNORMAL HIGH (ref 1.7–7.7)
Neutrophils Relative %: 73 %
Platelets: 312 10*3/uL (ref 150–400)
RBC: 4.6 MIL/uL (ref 4.22–5.81)
RDW: 13.4 % (ref 11.5–15.5)
WBC: 12.8 10*3/uL — ABNORMAL HIGH (ref 4.0–10.5)
nRBC: 0 % (ref 0.0–0.2)

## 2022-05-27 LAB — LITHIUM LEVEL: Lithium Lvl: 0.52 mmol/L — ABNORMAL LOW (ref 0.60–1.20)

## 2022-05-27 LAB — CBG MONITORING, ED: Glucose-Capillary: 192 mg/dL — ABNORMAL HIGH (ref 70–99)

## 2022-05-27 MED ORDER — OLANZAPINE 5 MG PO TBDP: 10.0000 mg | ORAL_TABLET | Freq: Three times a day (TID) | ORAL | Status: DC | PRN

## 2022-05-27 MED ORDER — LORAZEPAM 2 MG/ML IJ SOLN
1.0000 mg | Freq: Three times a day (TID) | INTRAMUSCULAR | Status: DC | PRN
  Administered 2022-05-28: 1 mg via INTRAMUSCULAR
  Filled 2022-05-27: qty 1

## 2022-05-27 MED ORDER — ZIPRASIDONE MESYLATE 20 MG IM SOLR
20.0000 mg | INTRAMUSCULAR | Status: DC | PRN
Start: 2022-05-27 — End: 2022-05-30

## 2022-05-27 MED ORDER — DIPHENHYDRAMINE HCL 50 MG/ML IJ SOLN
50.0000 mg | Freq: Three times a day (TID) | INTRAMUSCULAR | Status: DC | PRN
  Administered 2022-05-28: 50 mg via INTRAMUSCULAR

## 2022-05-27 MED ORDER — LORAZEPAM 1 MG PO TABS
1.0000 mg | ORAL_TABLET | ORAL | Status: AC | PRN
  Administered 2022-05-27: 1 mg via ORAL
  Filled 2022-05-27: qty 1

## 2022-05-27 MED ORDER — HALOPERIDOL 5 MG PO TABS
5.0000 mg | ORAL_TABLET | Freq: Every day | ORAL | Status: DC
  Administered 2022-05-27 – 2022-05-29 (×3): 5 mg via ORAL
  Filled 2022-05-27 (×3): qty 1

## 2022-05-27 MED ORDER — HALOPERIDOL LACTATE 5 MG/ML IJ SOLN
10.0000 mg | Freq: Three times a day (TID) | INTRAMUSCULAR | Status: DC | PRN
  Administered 2022-05-28: 10 mg via INTRAMUSCULAR
  Filled 2022-05-27: qty 2

## 2022-05-27 NOTE — ED Notes (Signed)
Breakfast order placed ?

## 2022-05-27 NOTE — ED Notes (Signed)
Pt cooperative with staff at this time. Pt reports he will be good today. Nurse able to obtain VS and administer morning medication without disruption. Pt ambulated to restroom with a steady gait. Pt used the phone then walked back into room.

## 2022-05-27 NOTE — ED Notes (Signed)
Pt out of room talking loudly and crying with staff. Pt being more anxious. Talking about being set up and going to jail.

## 2022-05-27 NOTE — ED Notes (Signed)
Pt out of room in chair talking with staff. NAD at this time. Pt calm and cooperative.

## 2022-05-27 NOTE — Progress Notes (Signed)
[facility] Psych ED Progress Note  05/27/2022 11:32 AM Andrew Lindsey  MRN:  [id number]   Subjective:   Patient seen this morning at [facility] for face to face evaluation. Pt is sitting outside of his door with his sitter. When I say good morning to him he immediately states "I was bad last night. I was not good. But I am not hearing any voices today nothing." Pt tells me last night he was hearing AH of voices telling him to run so he doesn't go back to jail. It is documented by nursing that last night pt was agitated, uncooperative, and did walk off the unit causing security to get involved. Pt today says he is not experiencing any visual or auditory hallucinations. Denies suicidal or homicidal thoughts. No problems with appetite.   He has improved in his clarity of speech and being able to hold linear conversation. He is still tangential at times during conversation, randomly asking if he has to return to jail or talking about a woman named Gavin Pound or his ex wife. It appears pt behaviors of agitation and aggression normally happen in the evening/night time, could possibly be caused by AH. Will discontinue Haldol 2mg  po Qam and start haldol 5mg  po Qhs to target evening behaviors.   Principal Problem: Schizoaffective disorder, bipolar type (HCC) Diagnosis:  Principal Problem:   Schizoaffective disorder, bipolar type (HCC) Active Problems:   Agitation   ED Assessment Time Calculation: Start Time: 0915 Stop Time: 0935 Total Time in Minutes (Assessment Completion): 20   Past Psychiatric History:  See previous documentation  Scale:  Flowsheet Row ED from 05/16/2022 in [facility] EMERGENCY DEPARTMENT ED from 11/11/2021 in [facility] EMERGENCY DEPARTMENT ED from 11/06/2021 in [facility] EMERGENCY DEPARTMENT  C-SSRS RISK CATEGORY High Risk No Risk No Risk       Past Medical History:  Past Medical History:  Diagnosis Date   Bipolar 1 disorder  (HCC)    Diabetes mellitus without complication (HCC)    Hypercholesteremia    Tourette's    History reviewed. No pertinent surgical history. Family History: History reviewed. No pertinent family history.  Social History:  Social History   Substance and Sexual Activity  Alcohol Use Not Currently     Social History   Substance and Sexual Activity  Drug Use Never    Social History   Socioeconomic History   Marital status: Married    Spouse name: Not on file   Number of children: Not on file   Years of education: Not on file   Highest education level: Not on file  Occupational History   Not on file  Tobacco Use   Smoking status: Never   Smokeless tobacco: Never  Vaping Use   Vaping Use: Never used  Substance and Sexual Activity   Alcohol use: Not Currently   Drug use: Never   Sexual activity: Not on file  Other Topics Concern   Not on file  Social History Narrative   Not on file   Social Determinants of Health   Financial Resource Strain: Not on file  Food Insecurity: Not on file  Transportation Needs: Not on file  Physical Activity: Not on file  Stress: Not on file  Social Connections: Not on file    Sleep: Fair  Appetite:  Good  Current Medications: Current Facility-Administered Medications  Medication Dose Route Frequency Provider Last Rate Last Admin   acetaminophen (TYLENOL) tablet 650 mg  650 mg Oral Q4H  PRN Darrick Grinder, PA-C       ARIPiprazole (ABILIFY) tablet 20 mg  20 mg Oral Daily Rankin, Shuvon B, NP   20 mg at 05/27/22 1010   haloperidol lactate (HALDOL) injection 10 mg  10 mg Intramuscular TID PRN Ophelia Shoulder E, NP   10 mg at 05/26/22 2234   And   diphenhydrAMINE (BENADRYL) injection 50 mg  50 mg Intramuscular TID PRN Chales Abrahams, NP   50 mg at 05/26/22 2234   And   LORazepam (ATIVAN) injection 1 mg  1 mg Intramuscular TID PRN Ophelia Shoulder E, NP   1 mg at 05/26/22 2233   haloperidol (HALDOL) tablet 2 mg  2 mg Oral Daily Ophelia Shoulder E, NP   2 mg at 05/27/22 0852   lithium carbonate (ESKALITH) CR tablet 450 mg  450 mg Oral Q12H Eligha Bridegroom, NP   450 mg at 05/27/22 1010   LORazepam (ATIVAN) injection 2 mg  2 mg Intramuscular Once Tegeler, Canary Brim, MD       LORazepam (ATIVAN) tablet 1 mg  1 mg Oral BID PRN Eligha Bridegroom, NP   1 mg at 05/25/22 1649   metFORMIN (GLUCOPHAGE) tablet 500 mg  500 mg Oral QPM Pricilla Loveless, MD   500 mg at 05/26/22 1734   ondansetron (ZOFRAN) tablet 4 mg  4 mg Oral Q8H PRN Barrie Dunker B, PA-C       propranolol (INDERAL) tablet 10 mg  10 mg Oral BID Pricilla Loveless, MD   10 mg at 05/27/22 1010   simvastatin (ZOCOR) tablet 10 mg  10 mg Oral QPM Pricilla Loveless, MD   10 mg at 05/26/22 1734   traZODone (DESYREL) tablet 100 mg  100 mg Oral QHS PRN Rankin, Shuvon B, NP   100 mg at 05/26/22 2050   Current Outpatient Medications  Medication Sig Dispense Refill   glipiZIDE (GLUCOTROL) 5 MG tablet Take 5 mg by mouth every evening.     lithium carbonate (ESKALITH) 450 MG CR tablet Take 900 mg by mouth every evening.     metFORMIN (GLUCOPHAGE) 500 MG tablet Take 500 mg by mouth every evening.     propranolol (INDERAL) 10 MG tablet Take 10 mg by mouth 2 (two) times daily.     simvastatin (ZOCOR) 10 MG tablet Take 10 mg by mouth every evening.      Lab Results:  Results for orders placed or performed during the hospital encounter of 05/16/22 (from the past 48 hour(s))  CBG monitoring, ED     Status: Abnormal   Collection Time: 05/25/22  1:33 PM  Result Value Ref Range   Glucose-Capillary 235 (H) 70 - 99 mg/dL    Comment: Glucose reference range applies only to samples taken after fasting for at least 8 hours.  CBG monitoring, ED     Status: Abnormal   Collection Time: 05/25/22 11:23 PM  Result Value Ref Range   Glucose-Capillary 203 (H) 70 - 99 mg/dL    Comment: Glucose reference range applies only to samples taken after fasting for at least 8 hours.    Blood Alcohol level:   Lab Results  Component Value Date   [facility] <10 05/16/2022   ETH <10 11/11/2021    Psychiatric Specialty Exam:  Presentation  General Appearance: Fairly Groomed  Eye Contact:Fair  Speech:Pressured (slightly garbled, has improved in clarity)  Speech Volume:Normal  Handedness:Right   Mood and Affect  Mood:Irritable  Affect:Congruent   Thought Process  Thought Processes:Linear  Descriptions of Associations:Tangential  Orientation:Full (Time, Place and Person)  Thought Content:Tangential  History of Schizophrenia/Schizoaffective disorder:No (Pt said he has been dx w/schizophrenia; wife disputed)  Duration of Psychotic Symptoms:Less than six months  Hallucinations:Hallucinations: None  Ideas of Reference:Paranoia  Suicidal Thoughts:Suicidal Thoughts: No  Homicidal Thoughts:Homicidal Thoughts: No   Sensorium  Memory:Immediate Fair  Judgment:Impaired  Insight:Poor   Executive Functions  Concentration:Fair  Attention Span:Fair  Recall:Fair  Fund of Knowledge:Fair  Language:Fair   Psychomotor Activity  Psychomotor Activity:Psychomotor Activity: Tremor; Restlessness   Assets  Assets:Physical Health; Resilience   Sleep  Sleep:Sleep: Fair    Physical Exam: Physical Exam Neurological:     Mental Status: He is alert and oriented to person, place, and time.  Psychiatric:        Mood and Affect: Affect is labile.        Behavior: Behavior is cooperative.        Thought Content: Thought content is paranoid.        Judgment: Judgment is impulsive.    Review of Systems  Psychiatric/Behavioral:  Negative for hallucinations and suicidal ideas.   All other systems reviewed and are negative.  Blood pressure 116/76, pulse 92, temperature 98.5 F (36.9 C), temperature source Oral, resp. rate 18, SpO2 100 %. There is no height or weight on file to calculate BMI.   Medical Decision Making: Patient is currently on [facility] wait list, will continue  with this recommendation. - Will discontinue Haldol 2mg  po Qam and start Haldol 5mg  po Qhs to target evening behaviors of agitation that could be related to [facility] - Ordered Lithium level, CBC w/ diff, and CMP to be drawn tonight prior to lithium dose to recheck therapeutic levels -EKG ordered   , NP 05/27/2022, 11:32 AM

## 2022-05-27 NOTE — ED Provider Notes (Signed)
Emergency Medicine Observation Re-evaluation Note  Andrew Lindsey is a 41 y.o. male, seen on rounds today.  Pt initially presented to the ED for complaints of IVC Currently, the patient is talking to staff.  Physical Exam  BP 116/76   Pulse 92   Temp 98.5 F (36.9 C) (Oral)   Resp 18   SpO2 100%  Physical Exam General: Awake, alert, calm, cooperative Cardiac: Extremities perfused Lungs: Breathing is unlabored Psych: No agitation  ED Course / MDM  EKG:EKG Interpretation  Date/Time:  Sunday May 19 2022 12:19:24 EDT Ventricular Rate:  88 PR Interval:  144 QRS Duration: 90 QT Interval:  368 QTC Calculation: 445 R Axis:   75 Text Interpretation: Normal sinus rhythm Normal ECG When compared with ECG of 11-Nov-2021 12:41, PREVIOUS ECG IS PRESENT Confirmed by [name] ([id number]) on 05/19/2022 4:22:21 PM  I have reviewed the labs performed to date as well as medications administered while in observation.  Recent changes in the last 24 hours include responding to internal stimuli overnight.  Plan  Current plan is for Andrew Lindsey admission. Andrew Lindsey is under involuntary commitment.      [name], MD 05/27/22 1012

## 2022-05-27 NOTE — Progress Notes (Signed)
CSW received a phone call from Deanna with [facility] confirming that pt is still on the [facility] waitlist.   Maryjean Ka, MSW, [facility] 05/27/2022 3:07 PM

## 2022-05-28 ENCOUNTER — Encounter (HOSPITAL_COMMUNITY): Payer: Self-pay | Admitting: Nurse Practitioner

## 2022-05-28 LAB — CBG MONITORING, ED: Glucose-Capillary: 141 mg/dL — ABNORMAL HIGH (ref 70–99)

## 2022-05-28 MED ORDER — LITHIUM CARBONATE ER 300 MG PO TBCR
600.0000 mg | EXTENDED_RELEASE_TABLET | Freq: Two times a day (BID) | ORAL | Status: DC
  Administered 2022-05-28 – 2022-05-30 (×5): 600 mg via ORAL
  Filled 2022-05-28 (×5): qty 2

## 2022-05-28 NOTE — ED Provider Notes (Signed)
Emergency Medicine Observation Re-evaluation Note  Andrew Lindsey is a 41 y.o. male, seen on rounds today.  Pt initially presented to the ED for complaints of IVC Currently, the patient is calm.  Physical Exam  BP 97/67 (BP Location: Left Arm)   Pulse 82   Temp 97.8 F (36.6 C) (Oral)   Resp 17   SpO2 99%  Physical Exam General: awake and alert Cardiac: rrr Lungs: cta b Psych: calm  ED Course / MDM  EKG:EKG Interpretation  Date/Time:  Sunday May 19 2022 12:19:24 EDT Ventricular Rate:  88 PR Interval:  144 QRS Duration: 90 QT Interval:  368 QTC Calculation: 445 R Axis:   75 Text Interpretation: Normal sinus rhythm Normal ECG When compared with ECG of 11-Nov-2021 12:41, PREVIOUS ECG IS PRESENT Confirmed by [name] ([id number]) on 05/19/2022 4:22:21 PM  I have reviewed the labs performed to date as well as medications administered while in observation.  Recent changes in the last 24 hours include pt getting seen again by psych   They have increased evening haldol to 5 mg.  No reports of problems overnight.  Lithium level ordered by psych and is low.  Pt continues to be on the [facility] wait list.  Plan  Current plan is for waiting for [facility]. Andrew Lindsey is under involuntary commitment.      Jacalyn Lefevre, MD 05/28/22 [phone]

## 2022-05-28 NOTE — ED Notes (Signed)
Patient showered. Fresh scrubs provided. Patient making phone call at desk.

## 2022-05-28 NOTE — Progress Notes (Signed)
[facility] Psych ED Progress Note  05/28/2022 11:43 AM Andrew Lindsey  MRN:  [id number]   Subjective:   Patient seen in his room at [facility] for face to face evaluation. He tells me had a much better night last night, and was able to rest. States he barely heard any voices last night which helped him sleep. He denies any auditory or visual hallucinations today. He denies any suicidal or homicidal thoughts. He tells me he is sleeping and eating well.   He has shown some significant improvement. Our conversation was more linear, he holds good eye contact and his speech is more clear. I asked him where he would go if he were to be discharged and he stated "I guess I'm homeless my wife left me. I guess I need a shelter. I don't want to go back to prison I just want to make sure I am not being sent there." Pt tells me he receives $914 in disability every month, but thinks his now ex wife might still have control of his check. I asked if he would take his medications outside of the hospital and he stated "I guess so if I dont have to go back to jail."   ED staff documents that he can still be seen at times responding to internal stimuli. It is possible that this could be the patient's tourette's, as he does have significant history of this and he has been denying hallucinations. His lithium level resulted yesterday subtherapeutic at 0.52 with slightly elevated WBC and neut. Discussed with Dr. Lucianne Muss and we will continue to titrate Lithium, and increase to 600 mg po BID.   Principal Problem: Schizoaffective disorder, bipolar type (HCC) Diagnosis:  Principal Problem:   Schizoaffective disorder, bipolar type (HCC) Active Problems:   Agitation   ED Assessment Time Calculation: Start Time: 0830 Stop Time: 0850 Total Time in Minutes (Assessment Completion): 20   Past Psychiatric History:  See previous documentation  [place] Scale:  Flowsheet Row ED from 05/16/2022 in [facility] EMERGENCY  DEPARTMENT ED from [facility]GENCY DEPART[facility] ED from 11/06/2021 in [facility] EMERGENCY DEPARTMENT  C-SSRS RISK CATEGORY High Risk No Risk No Risk       Past Medical History:  Past Medical History:  Diagnosis Date   Bipolar 1 disorder (HCC)    Diabetes mellitus without complication (HCC)    Hypercholesteremia    Tourette's    History reviewed. No pertinent surgical history. Family History: History reviewed. No pertinent family history.  Social History:  Social History   Substance and Sexual Activity  Alcohol Use Not Currently     Social History   Substance and Sexual Activity  Drug Use Never    Social History   Socioeconomic History   Marital status: Married    Spouse name: Not on file   Number of children: Not on file   Years of education: Not on file   Highest education level: Not on file  Occupational History   Not on file  Tobacco Use   Smoking status: Never   Smokeless tobacco: Never  Vaping Use   Vaping Use: Never used  Substance and Sexual Activity   Alcohol use: Not Currently   Drug use: Never   Sexual activity: Not on file  Other Topics Concern   Not on file  Social History Narrative   Not on file   Social Determinants of Health   Financial Resource Strain: Not on file  Food  Insecurity: Not on file  Transportation Needs: Not on file  Physical Activity: Not on file  Stress: Not on file  Social Connections: Not on file    Sleep: Good  Appetite:  Good  Current Medications: Current Facility-Administered Medications  Medication Dose Route Frequency Provider Last Rate Last Admin   acetaminophen (TYLENOL) tablet 650 mg  650 mg Oral Q4H PRN Barrie Dunker B, PA-C       ARIPiprazole (ABILIFY) tablet 20 mg  20 mg Oral Daily Rankin, Shuvon B, NP   20 mg at 05/28/22 0952   diphenhydrAMINE (BENADRYL) injection 50 mg  50 mg Intramuscular TID PRN Chales Abrahams, NP   50 mg at 05/26/22 2234    diphenhydrAMINE (BENADRYL) injection 50 mg  50 mg Intramuscular TID PRN [name], MD       haloperidol (HALDOL) tablet 5 mg  5 mg Oral QHS Eligha Bridegroom, NP   5 mg at 05/27/22 2206   haloperidol lactate (HALDOL) injection 10 mg  10 mg Intramuscular TID PRN [name], MD       lithium carbonate (LITHOBID) CR tablet 600 mg  600 mg Oral Q12H Eligha Bridegroom, NP   600 mg at 05/28/22 0350   LORazepam (ATIVAN) injection 1 mg  1 mg Intramuscular TID PRN [name], MD       LORazepam (ATIVAN) injection 2 mg  2 mg Intramuscular Once Tegeler, Canary Brim, MD       LORazepam (ATIVAN) tablet 1 mg  1 mg Oral BID PRN Eligha Bridegroom, NP   1 mg at 05/25/22 1649   metFORMIN (GLUCOPHAGE) tablet 500 mg  500 mg Oral QPM Pricilla Loveless, MD   500 mg at 05/27/22 1803   OLANZapine zydis (ZYPREXA) disintegrating tablet 10 mg  10 mg Oral Q8H PRN [name], MD       And   ziprasidone (GEODON) injection 20 mg  20 mg Intramuscular PRN [name], MD       ondansetron (ZOFRAN) tablet 4 mg  4 mg Oral Q8H PRN Barrie Dunker B, PA-C       propranolol (INDERAL) tablet 10 mg  10 mg Oral BID Pricilla Loveless, MD   10 mg at 05/28/22 0952   simvastatin (ZOCOR) tablet 10 mg  10 mg Oral QPM Pricilla Loveless, MD   10 mg at 05/27/22 1802   traZODone (DESYREL) tablet 100 mg  100 mg Oral QHS PRN Rankin, Shuvon B, NP   100 mg at 05/27/22 2206   Current Outpatient Medications  Medication Sig Dispense Refill   glipiZIDE (GLUCOTROL) 5 MG tablet Take 5 mg by mouth every evening.     lithium carbonate (ESKALITH) 450 MG CR tablet Take 900 mg by mouth every evening.     metFORMIN (GLUCOPHAGE) 500 MG tablet Take 500 mg by mouth every evening.     propranolol (INDERAL) 10 MG tablet Take 10 mg by mouth 2 (two) times daily.     simvastatin (ZOCOR) 10 MG tablet Take 10 mg by mouth every evening.      Lab Results:  Results for orders placed or performed during the hospital encounter of 05/16/22 (from the past 48 hour(s))  CBG  monitoring, ED     Status: Abnormal   Collection Time: 05/27/22  6:18 PM  Result Value Ref Range   Glucose-Capillary 192 (H) 70 - 99 mg/dL    Comment: Glucose reference range applies only to samples taken after fasting for at least 8 hours.  Lithium level  Status: Abnormal   Collection Time: 05/27/22  7:15 PM  Result Value Ref Range   Lithium Lvl 0.52 (L) 0.60 - 1.20 mmol/L    Comment: Performed at [facility], [address] [place], [place] [address]  CBC with Differential/Platelet     Status: Abnormal   Collection Time: 05/27/22  7:23 PM  Result Value Ref Range   WBC 12.8 (H) 4.0 - 10.5 K/uL   RBC 4.60 4.22 - 5.81 MIL/uL   Hemoglobin 13.3 13.0 - 17.0 g/dL   HCT 01.0 27.2 - 53.6 %   MCV 86.1 80.0 - 100.0 fL   MCH 28.9 26.0 - 34.0 pg   MCHC 33.6 30.0 - 36.0 g/dL   RDW 64.4 03.4 - 74.2 %   Platelets 312 150 - 400 K/uL   nRBC 0.0 0.0 - 0.2 %   Neutrophils Relative % 73 %   Neutro Abs 9.4 (H) 1.7 - 7.7 K/uL   Lymphocytes Relative 18 %   Lymphs Abs 2.3 0.7 - 4.0 K/uL   Monocytes Relative 4 %   Monocytes Absolute 0.5 0.1 - 1.0 K/uL   Eosinophils Relative 3 %   Eosinophils Absolute 0.3 0.0 - 0.5 K/uL   Basophils Relative 1 %   Basophils Absolute 0.1 0.0 - 0.1 K/uL   Immature Granulocytes 1 %   Abs Immature Granulocytes 0.10 (H) 0.00 - 0.07 K/uL    Comment: Performed at [facility], [address] [place], [place] [address]  Comprehensive metabolic panel     Status: Abnormal   Collection Time: 05/27/22  7:23 PM  Result Value Ref Range   Sodium 136 135 - 145 mmol/L   Potassium 4.2 3.5 - 5.1 mmol/L   Chloride 101 98 - 111 mmol/L   CO2 26 22 - 32 mmol/L   Glucose, Bld 214 (H) 70 - 99 mg/dL    Comment: Glucose reference range applies only to samples taken after fasting for at least 8 hours.   BUN 19 6 - 20 mg/dL   Creatinine, Ser 8.75 0.61 - 1.24 mg/dL   Calcium 9.1 8.9 - 64.3 mg/dL   Total Protein 6.3 (L) 6.5 - 8.1 g/dL   Albumin 3.7 3.5 - 5.0 g/dL   AST 23  15 - 41 U/L   ALT 18 0 - 44 U/L   Alkaline Phosphatase 74 38 - 126 U/L   Total Bilirubin 0.7 0.3 - 1.2 mg/dL   GFR, Estimated >32 >95 mL/min    Comment: (NOTE) Calculated using the CKD-EPI Creatinine Equation (2021)    Anion gap 9 5 - 15    Comment: Performed at [facility], [address], [place], [place] [address]  CBG monitoring, ED     Status: Abnormal   Collection Time: 05/28/22  6:18 AM  Result Value Ref Range   Glucose-Capillary 141 (H) 70 - 99 mg/dL    Comment: Glucose reference range applies only to samples taken after fasting for at least 8 hours.    Blood Alcohol level:  Lab Results  Component Value Date   [facility] <10 05/16/2022   ETH <10 11/11/2021    Psychiatric Specialty Exam:  Presentation  General Appearance: Fairly Groomed  Eye Contact:Good  Speech:-- (improvement in clarity and coherency)  Speech Volume:Normal  Handedness:Right   Mood and Affect  Mood:Euthymic  Affect:Congruent   Thought Process  Thought Processes:Linear  Descriptions of Associations:Circumstantial  Orientation:Full (Time, Place and Person)  Thought Content:Perseveration (more logical, continues to perseverate  on returning to jail)  History of Schizophrenia/Schizoaffective disorder:No (Pt said he has been dx w/schizophrenia; wife disputed)  Duration of Psychotic Symptoms:Less than six months  Hallucinations:Hallucinations: None  Ideas of Reference:None  Suicidal Thoughts:Suicidal Thoughts: No  Homicidal Thoughts:Homicidal Thoughts: No   Sensorium  Memory:Immediate Fair  Judgment:Fair  Insight:Fair   Executive Functions  Concentration:Fair  Attention Span:Fair  Recall:Fair  Fund of Knowledge:Fair  Language:Fair   Psychomotor Activity  Psychomotor Activity:Psychomotor Activity: Restlessness   Assets  Assets:Desire for Improvement; Physical Health; Resilience   Sleep  Sleep:Sleep: Good    Physical Exam: Physical Exam Neurological:      Mental Status: He is alert and oriented to person, place, and time.  Psychiatric:        Thought Content: Thought content normal.        Judgment: Judgment is impulsive.    Review of Systems  Psychiatric/Behavioral:  Negative for hallucinations and suicidal ideas.   All other systems reviewed and are negative.  Blood pressure 103/71, pulse 93, temperature 98.6 F (37 C), temperature source Oral, resp. rate 16, SpO2 97 %. There is no height or weight on file to calculate BMI.   Medical Decision Making: - Lithium increased to 600 mg BID - Patient has shown significant progress since hospital admission. It is likely that patient will be able to discharge tomorrow.  - TOC consult placed to assist with d/c arrangements. Would like pt to [facility] to [facility] since he is newly homeless.  -LCSW notified of possible discharge and resources for follow up care and med management have been placed in AVS.  - RN and EDP notified of updated disposition   Eligha Bridegroom, NP 05/28/2022, 11:43 AM

## 2022-05-28 NOTE — Discharge Instructions (Addendum)
It was our pleasure to provide your ER care today - we hope that you feel better.  Take your meds as prescribed.   Follow up closely with primary care doctor and behavioral health provider in the coming week.  For mental health issues and/or crisis, you may also go directly to the [facility]al [facility] - they are open 24/7 and walk-ins are welcome.   Return to ER if worse, new symptoms, chest pain, trouble breathing, fevers, or other concern.   For your behavioral health needs you are advised to follow up with one of the providers listed below at your earliest opportunity:       [facility]      [address] [place], [place] [address]      [phone]      They offer psychiatry/medication management and therapy.  New patients are seen in their walk-in clinic.  Walk-in hours are Monday, Wednesday, Thursday and Friday from 8:00 am - 11:00 am for psychiatry, and Monday and Wednesday from 8:00 am - 11:00 am for therapy.  Walk-in patients are seen on a first come, first served basis, so try to arrive as early as possible for the best chance of being seen the same day.  BE SURE TO TAKE THE ELEVATOR TO THE SECOND FLOOR.  Please note that to be eligible for services you must bring an ID or a piece of mail with your name and a [facility] address.       [facility]      [address]      [place], [place] [address]       [phone]   You may be eligible for ACT Team services, which would include more frequent visits with your provider, as well as in-home services.  The following providers offer ACT Team services.  Contact them at your earliest opportunity to ask about enrolling in their program:       [facility]      [address]      [place], [place] [address]      [phone]      [place], [place] [address]      [phone]       [facility]      [address]      [place], [place] [address]      [phone]       [facility] ACT [facility]      [facility]      [place], [place]  [address]      [phone]       [facility]      [address]      [place], [place] [address]      [phone]   [facility]  [address] [place] [address] [phone]

## 2022-05-28 NOTE — ED Notes (Signed)
Patient became acutely anxious and began screaming at staff, unable to redirect or calm patient with verbal measures. Patient initially agreed to take PO ativan and then refused, started spitting at staff and [employer]. IM benadryl, ativan, and haldol administered with security and [employer] present. Patient currently lying at stretcher and responding to internal stimuli.

## 2022-05-28 NOTE — ED Notes (Signed)
Breakfast order placed ?

## 2022-05-28 NOTE — ED Notes (Signed)
Pt up out of room standing. Pt asked to go back in his room. Pt states he isn't going to say anything to the security guard and that he isn't going to hurt anyone. Pt advised that policy wouldn't allow him to just be in the hallway. Pt reluctantly went back to his room and opened his door all the way. Pt is now sitting on his bed responding to internal stimuli.

## 2022-05-29 LAB — CBG MONITORING, ED: Glucose-Capillary: 266 mg/dL — ABNORMAL HIGH (ref 70–99)

## 2022-05-29 NOTE — ED Notes (Signed)
Pt now calm and asleep in the bed

## 2022-05-29 NOTE — ED Notes (Signed)
Pt requesting medication because he is starting to hear things. Advised he has order for shot or pill. He opted for ativan pill

## 2022-05-29 NOTE — Consult Note (Signed)
  Pt seen today in his room at [facility]. Pt had anger outbursts towards staff yesterday evening and required IM PRN medication with security assistance. Pt today denies SI/HI/AVH. Stated he became angry last night because he wanted to leave. I mentioned to him that he can not leave the hospital unless he has shown behavioral control and improvement for another 24 hours. He is agreeable to this.   I questioned his insight. He tells me he is homeless and knows he can not return to the home he used to live in with ex wife. He said he does need to go there and get his money. Pt stated "I am not dumb I know I can't just show up there. I am going to have police go with me but she has been getting my disability checks and I need those." Pt stated he would take his medications, and requested they be sent to [facility]. Pt agreeable to go to [facility] for community and shelter resources.   Will be reevaluated by psychiatry tomorrow to assess for possible d/c.

## 2022-05-29 NOTE — ED Notes (Signed)
Pt anxious. Pt pacing around room. Pt loudly talking to himself. Pt cooperative.

## 2022-05-29 NOTE — ED Notes (Signed)
Pt came out asking to use the phone. He called someone and was asking about coming there if they discharge him. This RN is not sure what all was said but after phone call pt is agitated and is currently standing at his room door rambling about him being innocent.

## 2022-05-29 NOTE — ED Notes (Signed)
Breakfast order placed ?

## 2022-05-30 DIAGNOSIS — F250 Schizoaffective disorder, bipolar type: Secondary | ICD-10-CM

## 2022-05-30 MED ORDER — ARIPIPRAZOLE 20 MG PO TABS
20.0000 mg | ORAL_TABLET | Freq: Every day | ORAL | 0 refills | Status: AC
Start: 2022-05-31 — End: ?

## 2022-05-30 MED ORDER — TRAZODONE HCL 100 MG PO TABS
100.0000 mg | ORAL_TABLET | Freq: Every evening | ORAL | 0 refills | Status: AC | PRN
Start: 2022-05-30 — End: ?

## 2022-05-30 MED ORDER — LITHIUM CARBONATE ER 300 MG PO TBCR
600.0000 mg | EXTENDED_RELEASE_TABLET | Freq: Two times a day (BID) | ORAL | 0 refills | Status: AC
Start: 2022-05-30 — End: ?

## 2022-05-30 MED ORDER — PROPRANOLOL HCL 10 MG PO TABS
10.0000 mg | ORAL_TABLET | Freq: Two times a day (BID) | ORAL | 0 refills | Status: AC
Start: 2022-05-30 — End: ?

## 2022-05-30 MED ORDER — HALOPERIDOL 5 MG PO TABS
5.0000 mg | ORAL_TABLET | Freq: Every day | ORAL | 0 refills | Status: AC
Start: 2022-05-30 — End: ?

## 2022-05-30 NOTE — ED Notes (Signed)
IVC rescinded 

## 2022-05-30 NOTE — Consult Note (Signed)
Communicated with nurse via secure messaging regarding goal for pt reassessment and possibility of psych clearance as soon as time permits,  The tts cart is not available but she will reach out to me as soon as it's available.

## 2022-05-30 NOTE — ED Notes (Signed)
Mr. Kerner, on phone with family member 2 nd call. States the entire night staff hates him because he was hearing voices last night and he had a hard night. Pt getting verbally aggressive while on phone with family member.

## 2022-05-30 NOTE — ED Notes (Signed)
Andrew Lindsey, given bus pass, signed and given personal belongings. Walked out in his paper scrubs.

## 2022-05-30 NOTE — ED Notes (Signed)
ADLS performed by patient along with shower and change of clothing

## 2022-05-30 NOTE — Consult Note (Signed)
Reached out to nurse caring for pt, requested to see him for psych reassessment.  This Clinical research associate awaiting response.

## 2022-05-30 NOTE — Inpatient Diabetes Management (Signed)
Inpatient Diabetes Program Recommendations  AACE/ADA: New Consensus Statement on Inpatient Glycemic Control (2015)  Target Ranges:  Prepandial:   less than 140 mg/dL      Peak postprandial:   less than 180 mg/dL (1-2 hours)      Critically ill patients:  140 - 180 mg/dL   Lab Results  Component Value Date   GLUCAP 266 (H) 05/29/2022    Review of Glycemic Control  Latest Reference Range & Units 05/25/22 13:33 05/25/22 23:23 05/27/22 18:18 05/28/22 06:18 05/29/22 17:04  Glucose-Capillary 70 - 99 mg/dL 817 (H) 711 (H) 657 (H) 141 (H) 266 (H)   Diabetes history: DM 2 Outpatient Diabetes medications: metformin 500 mg Daily at supper, Glipizide 5 mg Daily at supper Current orders for Inpatient glycemic control:  Metformin 500 mg qevening  Inpatient Diabetes Program Recommendations:    Glucose trends elevated,  -  Order CBG checks to track glucose trends without all of pts home meds ordered   Thanks,  Christena Deem RN, MSN, BC-ADM Inpatient Diabetes Coordinator Team Pager [phone] (8a-5p)

## 2022-05-30 NOTE — ED Provider Notes (Signed)
Emergency Medicine Observation Re-evaluation Note  Decklan Mau is a 41 y.o. male, seen on rounds today.  Pt initially presented to the ED for psychiatric concerns, now under IVC. Currently, the patient is resting.  Physical Exam  BP 102/69 (BP Location: Right Arm)   Pulse 90   Temp 98.1 F (36.7 C) (Oral)   Resp 18   SpO2 98%  Physical Exam General: NAD Cardiac: Regular rate Lungs: No respiratory distress Psych: Stable  ED Course / MDM  EKG:EKG Interpretation  Date/Time:  Sunday May 19 2022 12:19:24 EDT Ventricular Rate:  88 PR Interval:  144 QRS Duration: 90 QT Interval:  368 QTC Calculation: 445 R Axis:   75 Text Interpretation: Normal sinus rhythm Normal ECG When compared with ECG of 11-Nov-2021 12:41, PREVIOUS ECG IS PRESENT Confirmed by [name] ([id number]) on 05/19/2022 4:22:21 PM  I have reviewed the labs performed to date as well as medications administered while in observation.   Plan  Current plan is for [facility] placement. [facility] Tallerico is under involuntary commitment.      Terald Sleeper, MD 05/30/22 [phone]

## 2022-05-30 NOTE — ED Provider Notes (Signed)
Emergency Medicine Observation Re-evaluation Note  Andrew Lindsey is a 41 y.o. male, seen on rounds today.  Pt initially presented to the ED for complaints of IVC/BH  eval.  Pt reports feeling much improved in past few days. He currently denies any acute physical or mental health complaint.   Pt indicates feels ready for d/c, states plan is to return to [place].    Physical Exam  BP 102/69 (BP Location: Right Arm)   Pulse 90   Temp 98.1 F (36.7 C) (Oral)   Resp 18   SpO2 98%  Physical Exam General: alert, content, conversant.  Cardiac: regular rate.  Lungs: breathing comfortably. Psych: normal mood and affect, calm and conversant. Pt does not appear acutely depressed. Pt denies thoughts of any arm to self or others. Pt does not currently appear to be responding to internal stimuli - no active delusions or hallucinations are noted.   ED Course / MDM    I have reviewed the labs performed to date as well as medications administered while in observation.  Recent changes in the last 24 hours include ED obs, BH eval, med management and reassessment.   Plan   Pt reports feeling much improved and ready for d/c.   Currently pt w normal mood and affect. No SI/HI.  No acute psychosis.   Pt appears stable for d/c.  Will provide outpatient pcp, bh and social service resources.   Return precautions provided.        [name], MD 05/30/22 1309

## 2022-05-30 NOTE — Consult Note (Signed)
Telepsych Consultation   Reason for Consult:  Psychiatric Reassessment Referring Physician:  Pamala Duffel Location of Patient:    [facility] Location of Provider: Other: virtual home office  Patient Identification: Andrew Lindsey MRN:  [id number] Principal Diagnosis: Schizoaffective disorder, bipolar type (HCC) Diagnosis:  Principal Problem:   Schizoaffective disorder, bipolar type (HCC) Active Problems:   Agitation   Total Time spent with patient: 30 minutes  Subjective:   Andrew Lindsey is a 41 y.o. male patient admitted under IVC for mental decompensation in [facility] setting of medication non-adherence.  Interval Progress Note 05/30/2022: Patient seen via telepsych by this provider; chart reviewed and consulted with Dr. Lucianne Muss on 05/30/22.  On evaluation Andrew Lindsey reports ,"I''m fine and very calm"  when asked about suicidal/homicidal ideations and audible and visual hallucinations he states,"they are all gone." Reports he's tolerating medications well, denies GI side effects or involuntary movements.  Reports he's eating and sleeping well and nursing notes correlate this.   Pt is clear and coherent, able to spontaneously initiate and appropriately engage in conversation.   Patient states his desire to return to [place] to stay with his cousin and "uncle Gabriel Rung" and his sister Byrd Hesselbach.  He plans to get a job in [place], states, "they love me and will take care of me." There he states he can get more support from his family, and outpatient services for medication.    HPI:   Andrew Lindsey was admitted for Schizoaffective disorder, bipolar type [facility]) and crisis management.  He was recommended for inpatient psychiatry but after being denies by several facilities due to behavioral concerns, he remained in [facility] ED where he was monitored for safety and prescribed medications to promote mood stability. He was treated with [facility] following medications aripiprazole 20mg  po daily;  haloperidol 5mg  po qhs; lithium carbonate CR tablets po BID; trazodone 100mg  po qhs prn sleep and lorazepam 1mg  po BID prn anxiety.  Per lab review his lithium levels are barely subtherapeutic at 0.32mmol/L; on arrival levels were 0.07.  Haik Mahoney was discharged with current medication and his medical problems were identified and treated as needed by [facility] attending ED provider. Home medications were restarted as appropriate.   Improvement was monitored by observation and daily report of symptom reduction.  Emotional and mental status was monitored by patient self reporting and input received by medical and hospital staff.     Jamerius Boeckman was evaluated by [facility] treatment team for stability and plans for continued recovery upon discharge.  [facility] Lindsey motivation was an integral factor for scheduling further treatment.  Employment, transportation, bed availability, health status, family support, and any pending legal issues were also considered during his. He was offered further treatment options upon discharge including but not limited to Residential, Intensive Outpatient, and Outpatient treatment.  Luismiguel Lamere will follow up with [facility] services as listed below under Follow Up Information.     Upon completion of this admission Andrew Lindsey was mentally stable for discharge denying suicidal/homicidal ideation, auditory/visual/tactile hallucinations, delusional thoughts and paranoia.            Past Psychiatric History: as outlined above Risk to Self:  no Risk to Others:  no Prior Inpatient Therapy:  yes  Prior Outpatient Therapy:  yes  Past Medical History:  Past Medical History:  Diagnosis Date   Bipolar 1 disorder (HCC)    Diabetes mellitus without complication (HCC)    Hypercholesteremia    Tourette's  History reviewed. No pertinent surgical history. Family History: History reviewed. No pertinent family history. Family Psychiatric  History:  unknown Social History:  Social History   Substance and Sexual Activity  Alcohol Use Not Currently     Social History   Substance and Sexual Activity  Drug Use Never    Social History   Socioeconomic History   Marital status: Married    Spouse name: Not on file   Number of children: Not on file   Years of education: Not on file   Highest education level: Not on file  Occupational History   Not on file  Tobacco Use   Smoking status: Never   Smokeless tobacco: Never  Vaping Use   Vaping Use: Never used  Substance and Sexual Activity   Alcohol use: Not Currently   Drug use: Never   Sexual activity: Not on file  Other Topics Concern   Not on file  Social History Narrative   Not on file   Social Determinants of Health   Financial Resource Strain: Not on file  Food Insecurity: Not on file  Transportation Needs: Not on file  Physical Activity: Not on file  Stress: Not on file  Social Connections: Not on file   Additional Social History:    Allergies:   Allergies  Allergen Reactions   Pravastatin Other (See Comments)    Unknown reaction    Labs:  Results for orders placed or performed during [facility] hospital encounter of 05/16/22 (from [facility] past 48 hour(s))  CBG monitoring, ED     Status: Abnormal   Collection Time: 05/29/22  5:04 PM  Result Value Ref Range   Glucose-Capillary 266 (H) 70 - 99 mg/dL    Comment: Glucose reference range applies only to samples taken after fasting for at least 8 hours.    Medications:  Current Facility-Administered Medications  Medication Dose Route Frequency Provider Last Rate Last Admin   acetaminophen (TYLENOL) tablet 650 mg  650 mg Oral Q4H PRN Barrie Dunker B, PA-C       ARIPiprazole (ABILIFY) tablet 20 mg  20 mg Oral Daily Rankin, Shuvon B, NP   20 mg at 05/30/22 1610   diphenhydrAMINE (BENADRYL) injection 50 mg  50 mg Intramuscular TID PRN Ophelia Shoulder E, NP   50 mg at 05/26/22 2234   diphenhydrAMINE (BENADRYL) injection  50 mg  50 mg Intramuscular TID PRN [name], MD   50 mg at 05/28/22 1508   haloperidol (HALDOL) tablet 5 mg  5 mg Oral QHS Eligha Bridegroom, NP   5 mg at 05/29/22 2106   haloperidol lactate (HALDOL) injection 10 mg  10 mg Intramuscular TID PRN [name], MD   10 mg at 05/28/22 1508   lithium carbonate (LITHOBID) CR tablet 600 mg  600 mg Oral Q12H Eligha Bridegroom, NP   600 mg at 05/30/22 9604   LORazepam (ATIVAN) injection 1 mg  1 mg Intramuscular TID PRN [name], MD   1 mg at 05/28/22 1507   LORazepam (ATIVAN) injection 2 mg  2 mg Intramuscular Once Tegeler, Canary Brim, MD       LORazepam (ATIVAN) tablet 1 mg  1 mg Oral BID PRN Eligha Bridegroom, NP   1 mg at 05/30/22 5409   metFORMIN (GLUCOPHAGE) tablet 500 mg  500 mg Oral QPM Pricilla Loveless, MD   500 mg at 05/29/22 1759   OLANZapine zydis (ZYPREXA) disintegrating tablet 10 mg  10 mg Oral Q8H PRN [name], MD  And   ziprasidone (GEODON) injection 20 mg  20 mg Intramuscular PRN Godfrey Pick, MD       ondansetron [facility]) tablet 4 mg  4 mg Oral Q8H PRN Dorothyann Peng, PA-C       propranolol (INDERAL) tablet 10 mg  10 mg Oral BID Sherwood Gambler, MD   10 mg at 05/30/22 [id number]   simvastatin (ZOCOR) tablet 10 mg  10 mg Oral QPM Sherwood Gambler, MD   10 mg at 05/29/22 1759   traZODone (DESYREL) tablet 100 mg  100 mg Oral QHS PRN Rankin, Shuvon B, NP   100 mg at 05/29/22 2106   Current Outpatient Medications  Medication Sig Dispense Refill   glipiZIDE (GLUCOTROL) 5 MG tablet Take 5 mg by mouth every evening.     metFORMIN (GLUCOPHAGE) 500 MG tablet Take 500 mg by mouth every evening.     simvastatin (ZOCOR) 10 MG tablet Take 10 mg by mouth every evening.     [START ON 05/31/2022] ARIPiprazole (ABILIFY) 20 MG tablet Take 1 tablet (20 mg total) by mouth daily. 30 tablet 0   haloperidol (HALDOL) 5 MG tablet Take 1 tablet (5 mg total) by mouth at bedtime. 30 tablet 0   lithium carbonate (LITHOBID) 300 MG CR tablet Take 2 tablets (600  mg total) by mouth every 12 (twelve) hours. 30 tablet 0   propranolol (INDERAL) 10 MG tablet Take 1 tablet (10 mg total) by mouth 2 (two) times daily. 60 tablet 0   traZODone (DESYREL) 100 MG tablet Take 1 tablet (100 mg total) by mouth at bedtime as needed for sleep. 30 tablet 0    Musculoskeletal: patient moves all extremities and independently ambulates Strength & Muscle Tone: within normal limits Gait & Station: normal Patient leans: N/A   Psychiatric Specialty Exam:  Presentation  General Appearance: Appropriate for Environment; Casual  Eye Contact:Good  Speech:Normal Rate  Speech Volume:Normal  Handedness:Right   Mood and Affect  Mood:Anxious  Affect:Appropriate; Congruent   Thought Process  Thought Processes:Coherent; Goal Directed  Descriptions of Associations:Intact  Orientation:Full (Time, Place and Person)  Thought Content:Logical (has improved since admissions and medication compliance)  History of Schizophrenia/Schizoaffective disorder:No (Pt said he has been dx w/schizophrenia; wife disputed)  Duration of Psychotic Symptoms:Less than six months  Hallucinations:Hallucinations: None  Ideas of Reference:None  Suicidal Thoughts:Suicidal Thoughts: No  Homicidal Thoughts:Homicidal Thoughts: No   Sensorium  Memory:Immediate Good; Recent Good; Remote Good  Judgment:Good (good while taking psychotropic medications but can be impulsive with medication non-compliance.)  Insight:Fair   Executive Functions  Concentration:Good  Attention Span:Good  [place] of Knowledge:Good  Language:Good   Psychomotor Activity  Psychomotor Activity:Psychomotor Activity: Normal (No involuntary movements seen)   Assets  Assets:Communication Skills; Financial Resources/Insurance; Social Support   Sleep  Sleep:Sleep: Good Number of Hours of Sleep: 7    Physical Exam: Physical Exam Constitutional:      Appearance: Normal appearance.   Cardiovascular:     Rate and Rhythm: Normal rate.  Pulmonary:     Effort: Pulmonary effort is normal.  Musculoskeletal:        General: Normal range of motion.     Cervical back: Normal range of motion.  Neurological:     General: No focal deficit present.  Psychiatric:        Attention and Perception: Attention and perception normal.        Mood and Affect: Mood is anxious.        Speech: Speech normal.  Behavior: Behavior normal. Behavior is cooperative.        Thought Content: Thought content normal.        Cognition and Memory: Cognition and memory normal.        Judgment: Judgment normal.   Review of Systems  Constitutional: Negative.   HENT: Negative.    Eyes: Negative.   Respiratory: Negative.    Cardiovascular: Negative.   Gastrointestinal: Negative.   Genitourinary: Negative.   Musculoskeletal: Negative.   Skin: Negative.   Neurological: Negative.   Endo/Heme/Allergies: Negative.   Psychiatric/Behavioral:  Negative for depression, hallucinations, substance abuse and suicidal ideas. [facility] patient is nervous/anxious. [facility] patient does not have insomnia.    Blood pressure 102/69, pulse 90, temperature 98.1 F (36.7 C), temperature source Oral, resp. rate 18, SpO2 98 %. There is no height or weight on file to calculate BMI.  Treatment Plan Summary: Patient is alert and oriented; he is clear and coherent, and does not endorse suicidal of homicidal ideations.  His hallucinations have since cleared up and he's no longer responding to internal stimulus.  Patient appears to be at baseline, is future oriented and takes his medications daily as prescribed.  His judgement is good today and there are no acute safety concerns.  Of note, pt has a hx of medication non-compliance which ultimately leds to mental decline and impulsive behaviors. We discussed [facility] need to consistently take meds for mood stability.    Recommend he continue meds as ordered.  He can follow-up with  outpatient psychiatry at [facility] for medication management and therapy for stress management. I have asked SW to include this in [facility] discharge AVS. He would benefit from having a ACT team but will defer for now as pt reports his plan to relocate to [place].    TOC consult entered for transportation assistance.   Disposition: No evidence of imminent risk to self or others at present.   Patient does not meet criteria for psychiatric inpatient admission. Supportive therapy provided about ongoing stressors. Discussed crisis plan, support from social network, calling 911, coming to [facility] Emergency Department, and calling Suicide Hotline.  This service was provided via telemedicine using a 2-way, interactive audio and video technology.  Names of all persons participating in this telemedicine service and their role in this encounter. Name: Trennis Jude Role: Patient  Name: Merlyn Lot Role: PMHNP    Mallie Darting, NP 05/30/2022 1:22 PM

## 2022-05-30 NOTE — ED Notes (Signed)
Pt notified he will be discharged, has started to make calls. Mr. Buehl cooperative but anxious.

## 2022-05-30 NOTE — ED Provider Notes (Signed)
I reviewed Dr Norman Herrlich note, per behavioral health team's recommendations, the patient be reasonably safe and stable for discharge.  The patient has been repeatedly expressing desire wanting to leave the ED. I rescinded the IVC form.   Terald Sleeper, MD 05/30/22 1419

## 2022-05-30 NOTE — ED Provider Notes (Signed)
  Physical Exam  BP 102/69 (BP Location: Right Arm)   Pulse 90   Temp 98.1 F (36.7 C) (Oral)   Resp 18   SpO2 98%   Physical Exam  Procedures  Procedures  ED Course / MDM   Clinical Course as of 05/30/22 0813  Thu May 16, 2022  6071 41 year old male prior psychiatric history brought in by [facility] under IVC for not taking his medications and bizarre behavior.  Possibly hallucinating.  Patient getting screening labs and psychiatric evaluation.  Disposition per psychiatry recommendations [MB]    Clinical Course User Index [MB] Terrilee Files, MD   Continuing to await placement. Sleeping at time of my evaluation.  No acute concerns per nursing staff.       Alvira Monday, MD 05/30/22 [phone]
